# Patient Record
Sex: Male | Born: 1985 | Race: White | Hispanic: No | Marital: Single | Smoking: Never smoker
Health system: Southern US, Community
[De-identification: ages and names within clinical notes are randomized; demographics above are authoritative.]

## PROBLEM LIST (undated history)

## (undated) DIAGNOSIS — F419 Anxiety disorder, unspecified: Secondary | ICD-10-CM

## (undated) HISTORY — PX: WISDOM TOOTH EXTRACTION: SHX21

## (undated) HISTORY — DX: Anxiety disorder, unspecified: F41.9

---
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
## 2007-05-24 ENCOUNTER — Emergency Department (HOSPITAL_COMMUNITY): Admission: EM | Admit: 2007-05-24 | Discharge: 2007-05-25 | Payer: Self-pay | Admitting: Emergency Medicine

## 2008-07-23 ENCOUNTER — Emergency Department (HOSPITAL_COMMUNITY): Admission: EM | Admit: 2008-07-23 | Discharge: 2008-07-24 | Payer: Self-pay | Admitting: Emergency Medicine

## 2009-04-19 IMAGING — CT CT HEAD W/O CM
1 series · 16 of 30 positions shown, 20 images · non-contrast
Comparison: None

CLINICAL DATA: TRAUMA STATUS POST FALL

CT HEAD WITHOUT CONTRAST
TECHNIQUE: Contiguous axial images were obtained from the base of
the skull through the vertex without contrast

[Series 2: headseq 4.8 h45s · axial · 0.48mm/px · z∈[-86,+79]mm · 16 of 36 slices shown, 20 images]
[im 2/36  brain]
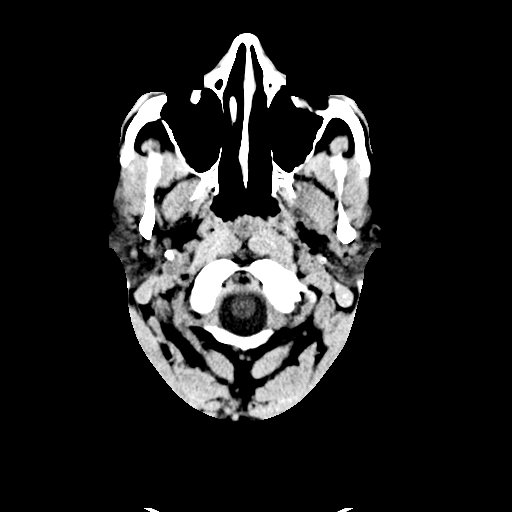
[im 2/36  bone]
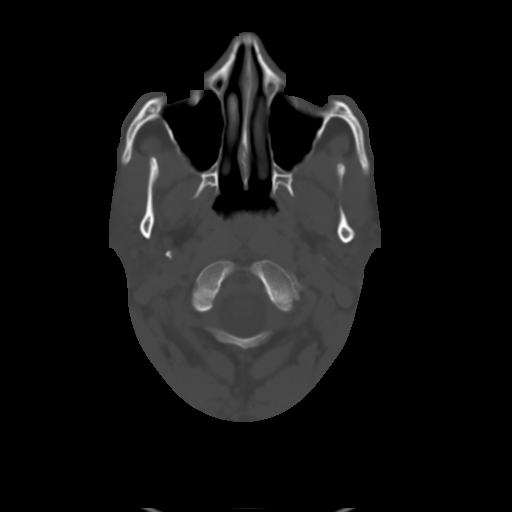
[im 4/36  brain]
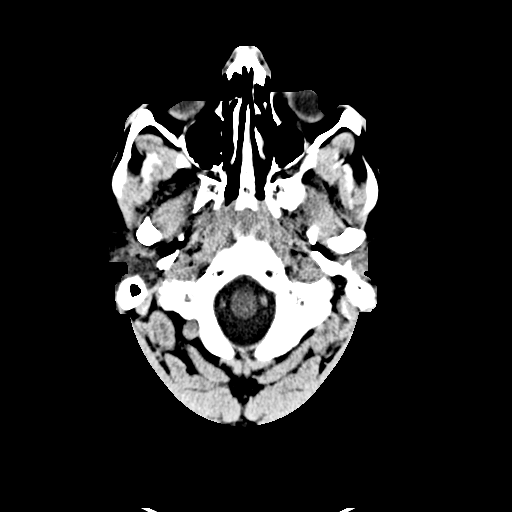
[im 7/36  brain]
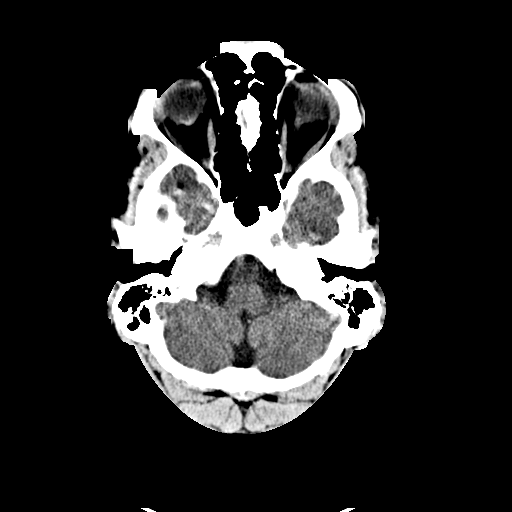
[im 9/36  brain]
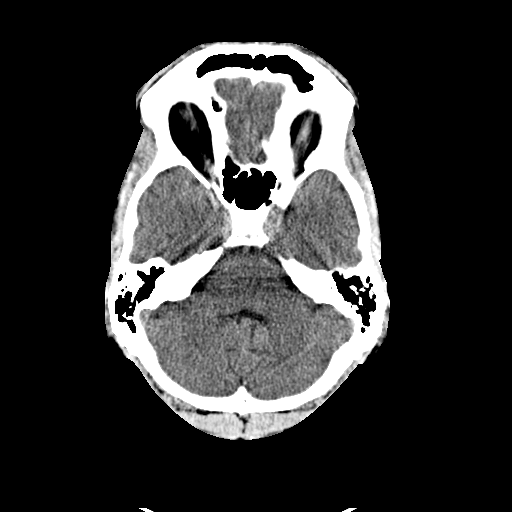
[im 10/36  brain]
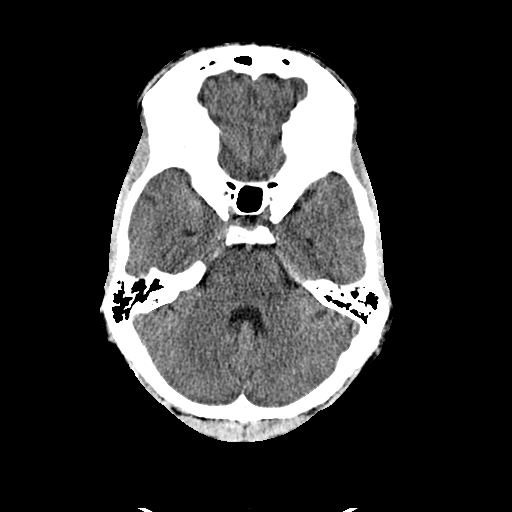
[im 10/36  bone]
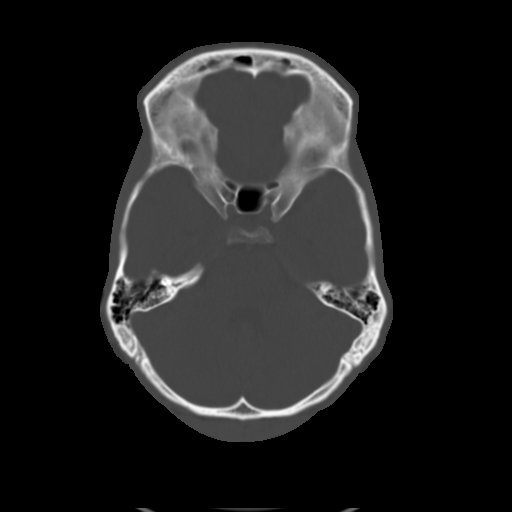
[im 13/36  brain]
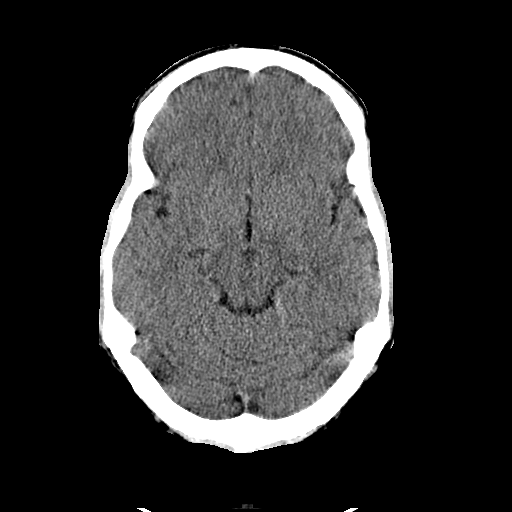
[im 15/36  brain]
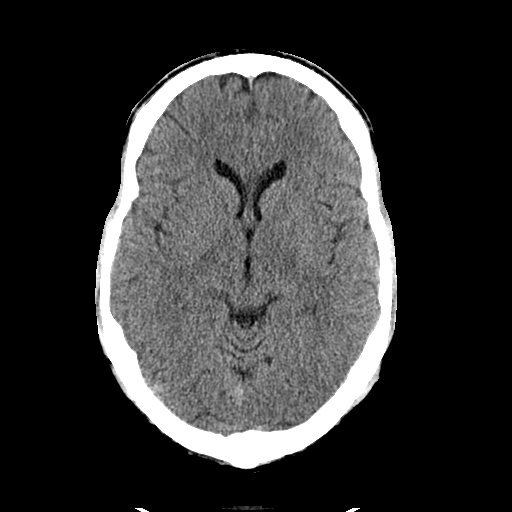
[im 17/36  brain]
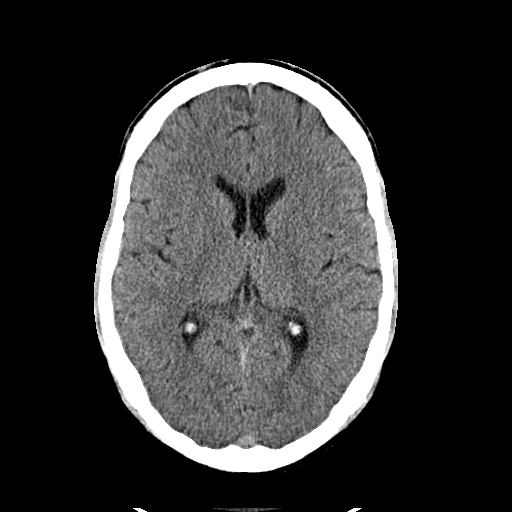
[im 19/36  brain]
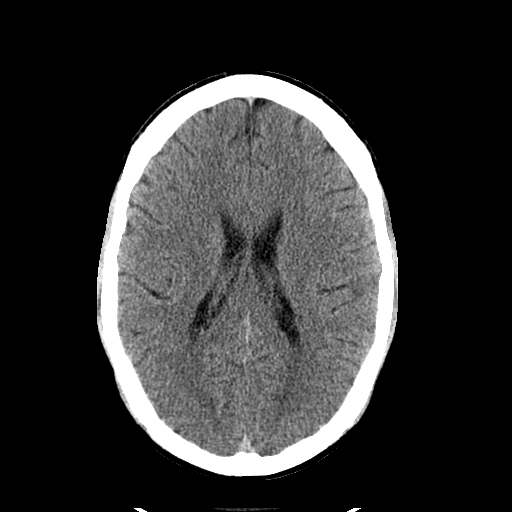
[im 19/36  bone]
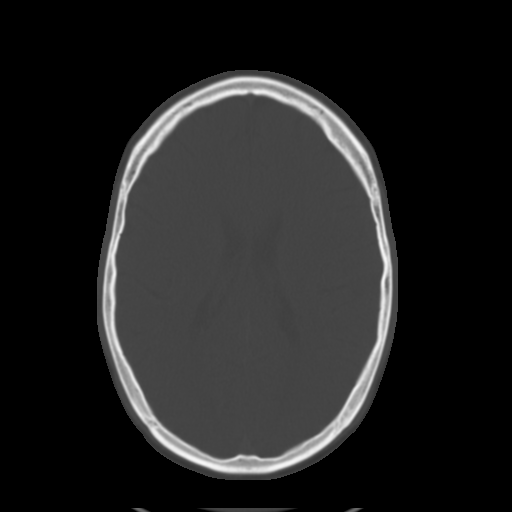
[im 21/36  brain]
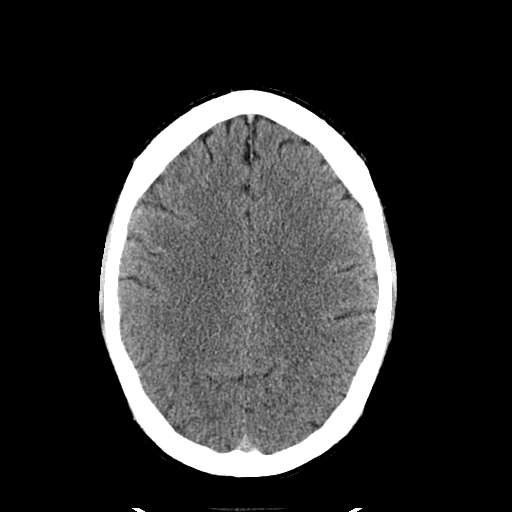
[im 23/36  brain]
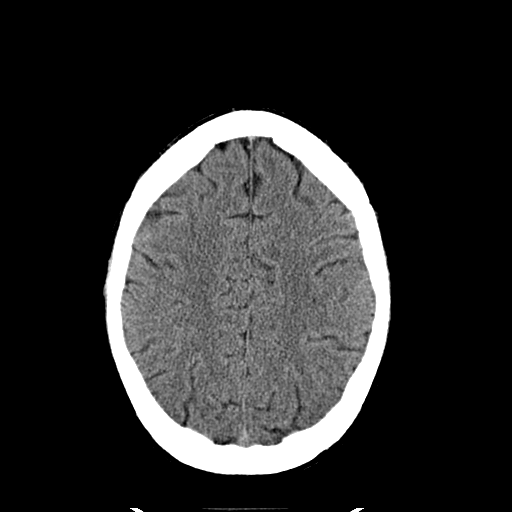
[im 26/36  brain]
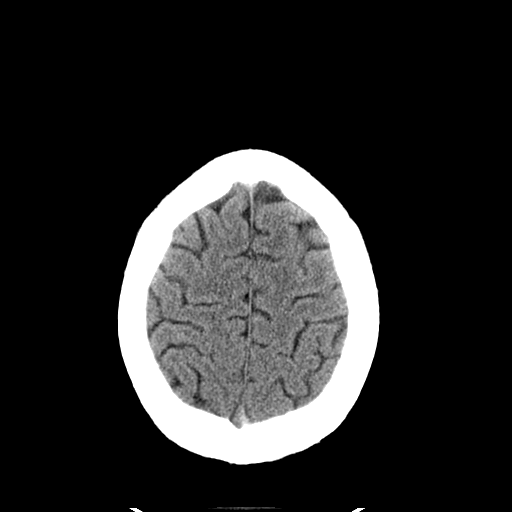
[im 27/36  brain]
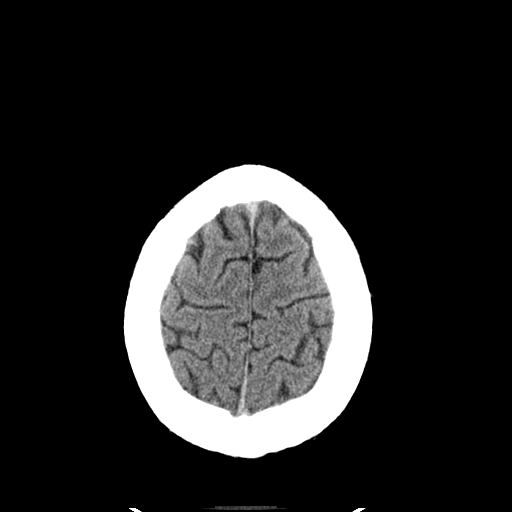
[im 27/36  bone]
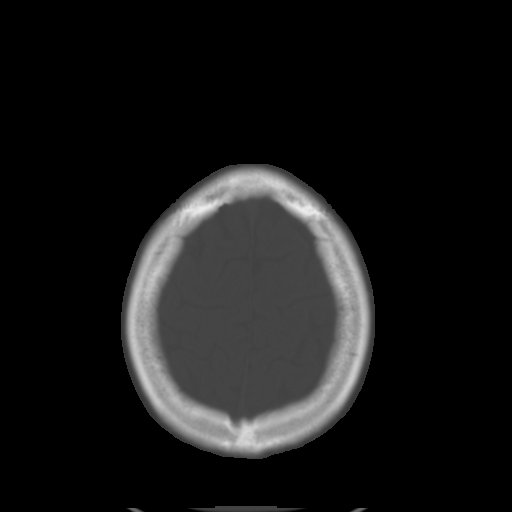
[im 29/36  brain]
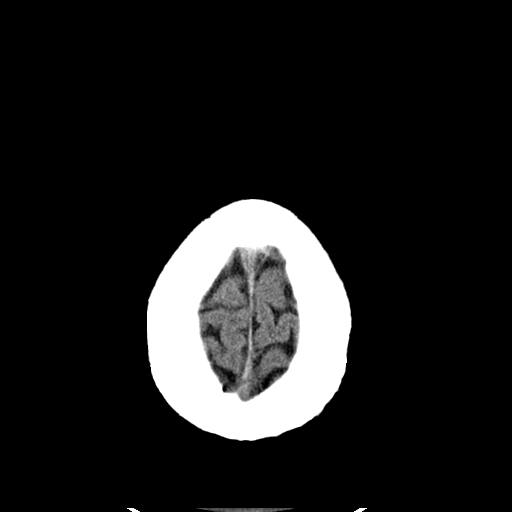
[im 32/36  brain]
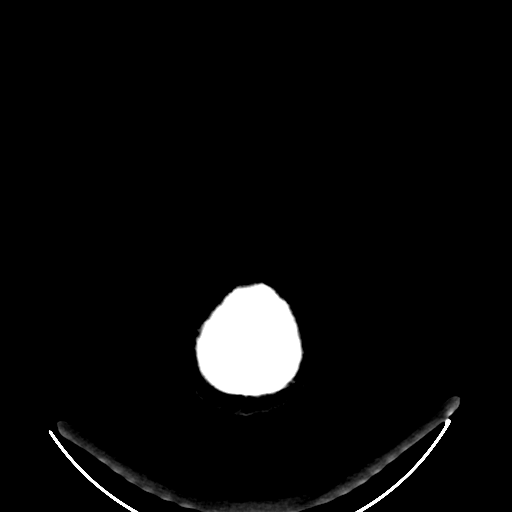
[im 34/36  brain]
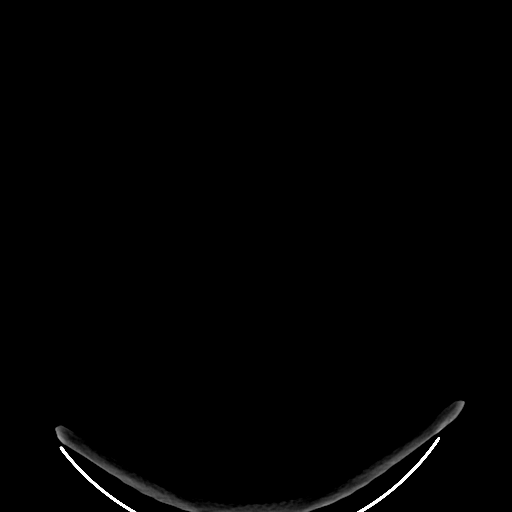

[16 of 30 positions shown; findings below may reference images not displayed]

FINDINGS: The brain has a normal appearance without evidence for
hemorrhage, acute infarction, hydrocephalus, or mass lesion.  There
is no extra axial fluid collection.  The skull and paranasal
sinuses are normal.
IMPRESSION: Normal CT of the head without contrast.

## 2010-05-28 LAB — COMPREHENSIVE METABOLIC PANEL
ALT: 54 U/L — ABNORMAL HIGH (ref 0–53)
AST: 53 U/L — ABNORMAL HIGH (ref 0–37)
Albumin: 4.5 g/dL (ref 3.5–5.2)
Alkaline Phosphatase: 92 U/L (ref 39–117)
BUN: 8 mg/dL (ref 6–23)
CO2: 26 mEq/L (ref 19–32)
Calcium: 9.8 mg/dL (ref 8.4–10.5)
Chloride: 100 mEq/L (ref 96–112)
Creatinine, Ser: 1 mg/dL (ref 0.4–1.5)
GFR calc Af Amer: >60 mL/min (ref >60)
GFR calc non Af Amer: >60 mL/min (ref >60)
Glucose, Bld: 95 mg/dL (ref 70–99)
Potassium: 3.7 mEq/L (ref 3.5–5.1)
Sodium: 138 mEq/L (ref 135–145)
Total Bilirubin: 1.1 mg/dL (ref 0.3–1.2)
Total Protein: 8 g/dL (ref 6.0–8.3)

## 2015-06-26 DIAGNOSIS — S20211A Contusion of right front wall of thorax, initial encounter: Secondary | ICD-10-CM | POA: Diagnosis not present

## 2015-06-26 DIAGNOSIS — S299XXA Unspecified injury of thorax, initial encounter: Secondary | ICD-10-CM | POA: Diagnosis not present

## 2015-12-05 DIAGNOSIS — M9902 Segmental and somatic dysfunction of thoracic region: Secondary | ICD-10-CM | POA: Diagnosis not present

## 2015-12-05 DIAGNOSIS — S161XXA Strain of muscle, fascia and tendon at neck level, initial encounter: Secondary | ICD-10-CM | POA: Diagnosis not present

## 2015-12-05 DIAGNOSIS — M9901 Segmental and somatic dysfunction of cervical region: Secondary | ICD-10-CM | POA: Diagnosis not present

## 2015-12-12 DIAGNOSIS — S39012S Strain of muscle, fascia and tendon of lower back, sequela: Secondary | ICD-10-CM | POA: Diagnosis not present

## 2015-12-12 DIAGNOSIS — M9903 Segmental and somatic dysfunction of lumbar region: Secondary | ICD-10-CM | POA: Diagnosis not present

## 2015-12-25 DIAGNOSIS — S39012D Strain of muscle, fascia and tendon of lower back, subsequent encounter: Secondary | ICD-10-CM | POA: Diagnosis not present

## 2015-12-25 DIAGNOSIS — Z23 Encounter for immunization: Secondary | ICD-10-CM | POA: Diagnosis not present

## 2015-12-25 DIAGNOSIS — M9903 Segmental and somatic dysfunction of lumbar region: Secondary | ICD-10-CM | POA: Diagnosis not present

## 2016-01-25 DIAGNOSIS — M47816 Spondylosis without myelopathy or radiculopathy, lumbar region: Secondary | ICD-10-CM | POA: Diagnosis not present

## 2016-01-25 DIAGNOSIS — M9903 Segmental and somatic dysfunction of lumbar region: Secondary | ICD-10-CM | POA: Diagnosis not present

## 2016-02-29 DIAGNOSIS — S161XXS Strain of muscle, fascia and tendon at neck level, sequela: Secondary | ICD-10-CM | POA: Diagnosis not present

## 2016-02-29 DIAGNOSIS — M9901 Segmental and somatic dysfunction of cervical region: Secondary | ICD-10-CM | POA: Diagnosis not present

## 2016-07-08 ENCOUNTER — Ambulatory Visit (INDEPENDENT_AMBULATORY_CARE_PROVIDER_SITE_OTHER): Payer: BLUE CROSS/BLUE SHIELD | Admitting: Physician Assistant

## 2016-07-08 ENCOUNTER — Ambulatory Visit (INDEPENDENT_AMBULATORY_CARE_PROVIDER_SITE_OTHER): Payer: BLUE CROSS/BLUE SHIELD

## 2016-07-08 ENCOUNTER — Encounter: Payer: Self-pay | Admitting: Physician Assistant

## 2016-07-08 VITALS — BP 132/74 | HR 74 | Temp 98.6°F | Ht 69.0 in | Wt 157.0 lb

## 2016-07-08 DIAGNOSIS — F419 Anxiety disorder, unspecified: Secondary | ICD-10-CM

## 2016-07-08 DIAGNOSIS — L700 Acne vulgaris: Secondary | ICD-10-CM | POA: Insufficient documentation

## 2016-07-08 DIAGNOSIS — S6990XA Unspecified injury of unspecified wrist, hand and finger(s), initial encounter: Secondary | ICD-10-CM

## 2016-07-08 DIAGNOSIS — M20001 Unspecified deformity of right finger(s): Secondary | ICD-10-CM

## 2016-07-08 DIAGNOSIS — S60946A Unspecified superficial injury of right little finger, initial encounter: Secondary | ICD-10-CM

## 2016-07-08 MED ORDER — ALPRAZOLAM 0.5 MG PO TABS: 0.5000 mg | ORAL_TABLET | Freq: Two times a day (BID) | ORAL | 5 refills | Status: DC

## 2016-07-08 NOTE — Progress Notes (Signed)
BP 132/74   Pulse 74   Temp 98.6 F (37 C) (Oral)   Ht 5\' 9"  (1.753 m)   Wt 157 lb (71.2 kg)   BMI 23.18 kg/m    Subjective:    Patient ID: Nathan Walker, male    DOB: 08-15-1985, 31 y.o.   MRN: [id number]  HPI: Nathan Walker is a 31 y.o. male presenting on 07/08/2016 for Hand Pain (right pinky finger, 3-4 wks ago, has not gotten any better)  Patient injured his right pinky finger 3 or 4 weeks ago while playing some sports. He caught a ball incorrectly and it jammed his finger and he has had swelling and decreased range of motion ever since then. He does not recall any specific severe pain when he first injured it. It continues to have swelling and decreased range of motion. Next  Patient is also having more flareup of his acne. Is been many years since he has seen a dermatologist. He would like a referral to a dermatologist. In the meantime he like refills on the medications that he is tried in the past.  Patient has chronic anxiety. He takes Klonopin on a when necessary basis. He does need a refill today. I've instructed them that we will need to see him every 6 months for refill on these medications.  Relevant past medical, surgical, family and social history reviewed and updated as indicated. Allergies and medications reviewed and updated.  Past Medical History:  Diagnosis Date  . Acne   . Anxiety     Past Surgical History:  Procedure Laterality Date  . WISDOM TOOTH EXTRACTION      Review of Systems  Constitutional: Negative.  Negative for appetite change and fatigue.  HENT: Negative.   Eyes: Negative.  Negative for pain and visual disturbance.  Respiratory: Negative.  Negative for cough, chest tightness, shortness of breath and wheezing.   Cardiovascular: Negative.  Negative for chest pain, palpitations and leg swelling.  Gastrointestinal: Negative.  Negative for abdominal pain, diarrhea, nausea and vomiting.  Endocrine: Negative.   Genitourinary: Negative.     Musculoskeletal: Positive for arthralgias and joint swelling.  Skin: Negative.  Negative for color change and rash.  Neurological: Negative.  Negative for weakness, numbness and headaches.  Psychiatric/Behavioral: The patient is nervous/anxious.     Allergies as of 07/08/2016   No Known Allergies     Medication List       Accurate as of 07/08/16  4:56 PM. Always use your most recent med list.          ALPRAZolam 0.5 MG tablet Commonly known as:  XANAX Take 1 tablet (0.5 mg total) by mouth 2 (two) times daily.   benzoyl peroxide 5 % external liquid Generic drug:  benzoyl peroxide USE ONCE DAILY FOR WASHING   ibuprofen 800 MG tablet Commonly known as:  ADVIL,MOTRIN Take 800 mg by mouth 3 (three) times daily.   sulfamethoxazole-trimethoprim 800-160 MG tablet Commonly known as:  BACTRIM DS,SEPTRA DS Take 1 tablet by mouth daily.          Objective:    BP 132/74   Pulse 74   Temp 98.6 F (37 C) (Oral)   Ht 5\' 9"  (1.753 m)   Wt 157 lb (71.2 kg)   BMI 23.18 kg/m   No Known Allergies  Physical Exam  Constitutional: He appears well-developed and well-nourished. No distress.  HENT:  Head: Normocephalic and atraumatic.  Eyes: Conjunctivae and EOM are normal. Pupils are equal,  round, and reactive to light.  Cardiovascular: Normal rate, regular rhythm and normal heart sounds.   Pulmonary/Chest: Effort normal and breath sounds normal. No respiratory distress.  Musculoskeletal:       Right hand: He exhibits decreased range of motion, tenderness and swelling. He exhibits no deformity.       Hands: Skin: Skin is warm and dry.  Psychiatric: He has a normal mood and affect. His behavior is normal.  Nursing note and vitals reviewed.       Assessment & Plan:   1. Finger injury, initial encounter - DG Finger Little Right; Future  2. Deviation of finger of right hand - DG Finger Little Right; Future  3. Anxiety - ALPRAZolam (XANAX) 0.5 MG tablet; Take 1 tablet  (0.5 mg total) by mouth 2 (two) times daily.  Dispense: 60 tablet; Refill: 5  4. Acne vulgaris - sulfamethoxazole-trimethoprim (BACTRIM DS,SEPTRA DS) 800-160 MG tablet; Take 1 tablet by mouth daily.; Refill: 1 - BENZOYL PEROXIDE 5 % external wash; USE ONCE DAILY FOR WASHING; Refill: 11 - Ambulatory referral to Dermatology   Continue all other maintenance medications as listed above.  Follow up plan: Return in about 6 months (around 01/08/2017) for recheck meds.  Educational handout given for sprain finger  [name] PA-C [facility] [address]  [place], [place] [address] [phone]   07/08/2016, 4:56 PM

## 2016-07-08 NOTE — Patient Instructions (Signed)
Finger Sprain  A finger sprain is an injury to one of the strong bands of tissue (ligaments) that connect the bones in the finger. The ligament can be stretched too much, or it can tear. A tear can be either partial or complete. The severity of the sprain depends on how much of the ligament was damaged or torn.  CAUSES  This injury is often caused by a fall or an accident. For example, if you extend your hands to catch an object or to protect yourself during a fall, the force of impact may cause the ligaments in your finger to stretch too much.  RISK FACTORS  The following factors may make you more likely to have this injury:   Playing sports that involve a greater risk of falling, such as skiing.   Playing sports that involve catching an object, such as basketball.   Having poor strength and flexibility.  SYMPTOMS  Symptoms of this condition include:   Pain at the affected finger joint, especially when bending or extending the finger.   Loss of motion in the finger.   Swelling.   Tenderness.   Bruising.  DIAGNOSIS  This condition is diagnosed with a medical history and physical exam. You may also have an X-ray of your finger to rule out a fracture or dislocation.  TREATMENT  Treatment varies depending on the severity of the sprain. If your ligament is overstretched or partially torn, treatment usually involves:   Keeping the finger in a fixed position (immobilization) for a period of time. To help you do this, your health care provider may apply a bandage, splint, or cast to keep the finger from moving until it heals. In some cases, the finger may be taped to the fingers beside it (buddy taping).   Taking medicines for pain.   Doing exercises for the finger after it has begun to heal.  If your ligament is fully torn, you may need surgery to reconnect the ligament to the bone. After surgery, a cast or splint will be applied.  HOME CARE INSTRUCTIONS  If You Have a Splint:   Wear the splint as told by  your health care provider. Remove it only as told by your health care provider.   Loosen the splint if your fingers tingle, become numb, or turn cold and blue.   Do not let your splint get wet if it is not waterproof.   Keep the splint clean.  If You Have a Cast:   Do not stick anything inside the cast to scratch your skin. Doing that increases your risk of infection.   Check the skin around the cast every day. Tell your health care provider about any concerns.   You may put lotion on dry skin around the edges of the cast. Do not put lotion on the skin underneath the cast.   Do not let your cast get wet if it is not waterproof.   Keep the cast clean.  Bathing   If your splint or cast is not waterproof, cover it with a watertight plastic bag when you take a bath or a shower.   Keep any bandages (dressings) dry until your health care provider says they can be removed.  Managing Pain, Stiffness, and Swelling   If directed, put ice on the injured area:  ? Put ice in a plastic bag.  ? Place a towel between your skin and the bag.  ? Leave the ice on for 20 minutes, 2-3 times a day.     Move your fingers often to avoid stiffness and to lessen swelling.   Raise (elevate) the injured area above the level of your heart while you are sitting or lying down.  General Instructions   Do not put pressure on any part of the cast or splint until it is fully hardened. This may take several hours.   Take over-the-counter and prescription medicines only as told by your health care provider.   Do not drive or operate heavy machinery while taking prescription pain medicine.   Do exercises as told by your health care provider or physical therapist.   Do not wear rings on your injured finger.   Keep all follow-up visits as told by your health care provider. This is important.  SEEK MEDICAL CARE IF:   Your pain is not controlled with medicine.   Your bruising or swelling gets worse.   Your cast or splint is  damaged.   Your finger is numb or blue.   Your finger feels colder than normal.  This information is not intended to replace advice given to you by your health care provider. Make sure you discuss any questions you have with your health care provider.  Document Released: 03/14/2004 Document Revised: 05/29/2015 Document Reviewed: 12/15/2014  Elsevier Interactive Patient Education  2017 [employer]vier Inc[employer]

## 2016-07-30 DIAGNOSIS — L709 Acne, unspecified: Secondary | ICD-10-CM | POA: Diagnosis not present

## 2016-07-30 DIAGNOSIS — D229 Melanocytic nevi, unspecified: Secondary | ICD-10-CM | POA: Diagnosis not present

## 2016-10-09 ENCOUNTER — Other Ambulatory Visit: Payer: Self-pay | Admitting: Physician Assistant

## 2016-10-09 DIAGNOSIS — L700 Acne vulgaris: Secondary | ICD-10-CM

## 2016-11-11 ENCOUNTER — Telehealth: Payer: Self-pay | Admitting: Physician Assistant

## 2016-11-11 MED ORDER — CLINDAMYCIN HCL 300 MG PO CAPS: 300.0000 mg | ORAL_CAPSULE | Freq: Three times a day (TID) | ORAL | 0 refills | Status: DC

## 2016-11-11 NOTE — Telephone Encounter (Signed)
Per pt he was seen by dentist He has infected tooth on L lower jaw back tooth Dentist wanted to do root canal but pt declined Pt requesting antibiotic 1st available appt with you is on Weds Please advise

## 2016-11-11 NOTE — Telephone Encounter (Signed)
Left detailed message for pt regarding RX 

## 2016-11-13 ENCOUNTER — Ambulatory Visit: Payer: BLUE CROSS/BLUE SHIELD | Admitting: Physician Assistant

## 2016-11-13 ENCOUNTER — Encounter: Payer: Self-pay | Admitting: Physician Assistant

## 2017-02-20 ENCOUNTER — Other Ambulatory Visit: Payer: Self-pay | Admitting: Physician Assistant

## 2017-02-20 DIAGNOSIS — F419 Anxiety disorder, unspecified: Secondary | ICD-10-CM

## 2017-03-14 ENCOUNTER — Ambulatory Visit: Payer: BLUE CROSS/BLUE SHIELD | Admitting: Physician Assistant

## 2017-03-14 ENCOUNTER — Encounter: Payer: Self-pay | Admitting: Physician Assistant

## 2017-03-14 VITALS — BP 132/80 | HR 78 | Temp 98.7°F | Ht 69.0 in | Wt 159.2 lb

## 2017-03-14 DIAGNOSIS — F419 Anxiety disorder, unspecified: Secondary | ICD-10-CM

## 2017-03-14 DIAGNOSIS — B079 Viral wart, unspecified: Secondary | ICD-10-CM | POA: Diagnosis not present

## 2017-03-14 DIAGNOSIS — L700 Acne vulgaris: Secondary | ICD-10-CM

## 2017-03-14 DIAGNOSIS — Z23 Encounter for immunization: Secondary | ICD-10-CM | POA: Diagnosis not present

## 2017-03-14 MED ORDER — ALPRAZOLAM 0.5 MG PO TABS: 0.5000 mg | ORAL_TABLET | Freq: Two times a day (BID) | ORAL | 5 refills | Status: DC

## 2017-03-14 MED ORDER — BENZOYL PEROXIDE 5 % EX LIQD: CUTANEOUS | 11 refills | Status: DC

## 2017-03-17 ENCOUNTER — Encounter: Payer: Self-pay | Admitting: Physician Assistant

## 2017-03-17 NOTE — Patient Instructions (Signed)
In a few days you may receive a survey in the mail or online from [employer] regarding your visit with us today. Please take a moment to fill this out. Your feedback is very important to our whole office. It can help us better understand your needs as well as improve your experience and satisfaction. Thank you for taking your time to complete it. We care about you.  Kiyo Heal, PA-C  

## 2017-03-17 NOTE — Progress Notes (Signed)
BP 132/80   Pulse 78   Temp 98.7 F (37.1 C) (Oral)   Ht 5\' 9"  (1.753 m)   Wt 159 lb 3.2 oz (72.2 kg)   BMI 23.51 kg/m    Subjective:    Patient ID: MD SMOLA, male    DOB: July 29, 1985, 32 y.o.   MRN: [id number]  HPI: Nathan Walker is a 32 y.o. male presenting on 03/14/2017 for Leg Pain (left )  Patient comes in for recheck on his conditions.  He does need refills on his acne medication hit with a ball when he was playing sports a large bruise that he showed me a picture from his phone.  He states that now she is mildly tender.  This portion of the surface.  There is no dysfunction in his legs.  She also has a very small wart that he started on his finger.  He has had a history of multiple common warts in the past.  Relevant past medical, surgical, family and social history reviewed and updated as indicated. Allergies and medications reviewed and updated.  Past Medical History:  Diagnosis Date  . Anxiety     Past Surgical History:  Procedure Laterality Date  . WISDOM TOOTH EXTRACTION      Review of Systems  Constitutional: Negative.  Negative for appetite change and fatigue.  HENT: Negative.   Eyes: Negative.  Negative for pain and visual disturbance.  Respiratory: Negative.  Negative for cough, chest tightness, shortness of breath and wheezing.   Cardiovascular: Negative.  Negative for chest pain, palpitations and leg swelling.  Gastrointestinal: Negative.  Negative for abdominal pain, diarrhea, nausea and vomiting.  Endocrine: Negative.   Genitourinary: Negative.   Musculoskeletal: Positive for myalgias.  Skin: Positive for rash. Negative for color change.  Neurological: Negative.  Negative for weakness, numbness and headaches.  Psychiatric/Behavioral: Negative.     Allergies as of 03/14/2017   No Known Allergies     Medication List        Accurate as of 03/14/17 11:59 PM. Always use your most recent med list.          ALPRAZolam 0.5 MG tablet Commonly  known as:  XANAX Take 1 tablet (0.5 mg total) by mouth 2 (two) times daily.   benzoyl peroxide 5 % external liquid Commonly known as:  benzoyl peroxide USE ONCE DAILY FOR WASHING   ibuprofen 800 MG tablet Commonly known as:  ADVIL,MOTRIN Take 800 mg by mouth 3 (three) times daily.   sulfamethoxazole-trimethoprim 800-160 MG tablet Commonly known as:  BACTRIM DS,SEPTRA DS Take 1 tablet by mouth daily.          Objective:    BP 132/80   Pulse 78   Temp 98.7 F (37.1 C) (Oral)   Ht 5\' 9"  (1.753 m)   Wt 159 lb 3.2 oz (72.2 kg)   BMI 23.51 kg/m   No Known Allergies  Physical Exam  Constitutional: He appears well-developed and well-nourished.  HENT:  Head: Normocephalic and atraumatic.  Eyes: Conjunctivae and EOM are normal. Pupils are equal, round, and reactive to light.  Neck: Normal range of motion. Neck supple.  Cardiovascular: Normal rate, regular rhythm and normal heart sounds.  Pulmonary/Chest: Effort normal and breath sounds normal.  Abdominal: Soft. Bowel sounds are normal.  Musculoskeletal: Normal range of motion.       Left upper leg: He exhibits tenderness. He exhibits no bony tenderness, no swelling, no edema and no deformity.  Legs: Left thigh area without discoloration.  Tenderness has resolved since injury a few months ago.  Skin: Skin is warm and dry. Lesion noted.  1 very small wart on        Assessment & Plan:   1. Viral warts, unspecified type Treatment to wart on the index finger Patient tolerated well  2. Anxiety - ALPRAZolam (XANAX) 0.5 MG tablet; Take 1 tablet (0.5 mg total) by mouth 2 (two) times daily.  Dispense: 60 tablet; Refill: 5  3. Need for immunization against influenza - Flu Vaccine QUAD 36+ mos IM  4. Acne vulgaris - benzoyl peroxide (BENZOYL PEROXIDE) 5 % external liquid; USE ONCE DAILY FOR WASHING  Dispense: 226 g; Refill: 11    Current Outpatient Medications:  .  ALPRAZolam (XANAX) 0.5 MG tablet, Take 1 tablet  (0.5 mg total) by mouth 2 (two) times daily., Disp: 60 tablet, Rfl: 5 .  benzoyl peroxide (BENZOYL PEROXIDE) 5 % external liquid, USE ONCE DAILY FOR WASHING, Disp: 226 g, Rfl: 11 .  ibuprofen (ADVIL,MOTRIN) 800 MG tablet, Take 800 mg by mouth 3 (three) times daily., Disp: , Rfl: 10 .  sulfamethoxazole-trimethoprim (BACTRIM DS,SEPTRA DS) 800-160 MG tablet, Take 1 tablet by mouth daily., Disp: , Rfl: 1 Continue all other maintenance medications as listed above.  Follow up plan: No Follow-up on file.  Educational handout given for survey  Remus LofflerAngel S. Wyland Rastetter PA-C [facility] [address]  [place], [place] [id number] [phone]   03/17/2017, 12:33 PM

## 2017-03-18 ENCOUNTER — Telehealth: Payer: Self-pay | Admitting: Physician Assistant

## 2017-03-18 ENCOUNTER — Other Ambulatory Visit: Payer: Self-pay | Admitting: Physician Assistant

## 2017-03-18 MED ORDER — GABAPENTIN 100 MG PO CAPS: 100.0000 mg | ORAL_CAPSULE | Freq: Every day | ORAL | 3 refills | Status: DC

## 2017-03-18 NOTE — Telephone Encounter (Signed)
sent 

## 2017-03-18 NOTE — Telephone Encounter (Signed)
Patient aware.

## 2017-04-29 ENCOUNTER — Telehealth: Payer: Self-pay | Admitting: Physician Assistant

## 2017-04-29 ENCOUNTER — Other Ambulatory Visit: Payer: Self-pay | Admitting: Physician Assistant

## 2017-04-29 MED ORDER — OSELTAMIVIR PHOSPHATE 75 MG PO CAPS: 75.0000 mg | ORAL_CAPSULE | Freq: Two times a day (BID) | ORAL | 0 refills | Status: DC

## 2017-04-29 NOTE — Telephone Encounter (Signed)
What symptoms do you have? Flu symptoms  How long have you been sick? Sunday  Have you been seen for this problem? No, offered appt but wants to ask Lawanna Kobusngel first if she can call him in tamiflu  If your provider decides to give you a prescription, which pharmacy would you like for it to be sent to[place] [facility] [place]   Patient informed that this information will be sent to the clinical staff for review and that they should receive a follow up call.

## 2017-04-29 NOTE — Telephone Encounter (Signed)
tamiflu sent

## 2017-04-29 NOTE — Telephone Encounter (Signed)
Patient aware.

## 2017-05-03 ENCOUNTER — Other Ambulatory Visit: Payer: Self-pay | Admitting: Physician Assistant

## 2017-05-03 DIAGNOSIS — L700 Acne vulgaris: Secondary | ICD-10-CM

## 2017-10-09 ENCOUNTER — Other Ambulatory Visit: Payer: Self-pay | Admitting: Physician Assistant

## 2017-10-09 DIAGNOSIS — F419 Anxiety disorder, unspecified: Secondary | ICD-10-CM

## 2017-10-13 NOTE — Telephone Encounter (Signed)
Last seen 03/14/17  Nathan Walker 

## 2018-01-07 ENCOUNTER — Other Ambulatory Visit: Payer: Self-pay | Admitting: Physician Assistant

## 2018-01-07 DIAGNOSIS — F419 Anxiety disorder, unspecified: Secondary | ICD-10-CM

## 2018-01-08 NOTE — Telephone Encounter (Signed)
Last seen 01/22/18

## 2018-01-12 ENCOUNTER — Encounter: Payer: Self-pay | Admitting: Physician Assistant

## 2018-01-12 ENCOUNTER — Ambulatory Visit: Payer: BLUE CROSS/BLUE SHIELD | Admitting: Physician Assistant

## 2018-01-12 VITALS — BP 123/71 | HR 72 | Temp 98.3°F | Ht 69.0 in | Wt 165.2 lb

## 2018-01-12 DIAGNOSIS — F419 Anxiety disorder, unspecified: Secondary | ICD-10-CM | POA: Diagnosis not present

## 2018-01-12 DIAGNOSIS — L659 Nonscarring hair loss, unspecified: Secondary | ICD-10-CM

## 2018-01-12 DIAGNOSIS — L700 Acne vulgaris: Secondary | ICD-10-CM

## 2018-01-12 DIAGNOSIS — J0110 Acute frontal sinusitis, unspecified: Secondary | ICD-10-CM | POA: Diagnosis not present

## 2018-01-12 MED ORDER — ALPRAZOLAM 0.5 MG PO TABS: 0.5000 mg | ORAL_TABLET | Freq: Two times a day (BID) | ORAL | 5 refills | Status: DC

## 2018-01-12 MED ORDER — CEFDINIR 300 MG PO CAPS: 300.0000 mg | ORAL_CAPSULE | Freq: Two times a day (BID) | ORAL | 0 refills | Status: DC

## 2018-01-12 MED ORDER — FINASTERIDE 1 MG PO TABS: 1.0000 mg | ORAL_TABLET | Freq: Every day | ORAL | 11 refills | Status: DC

## 2018-01-12 MED ORDER — MINOCYCLINE HCL 50 MG PO TABS: 50.0000 mg | ORAL_TABLET | Freq: Two times a day (BID) | ORAL | 11 refills | Status: DC

## 2018-01-12 NOTE — Progress Notes (Signed)
BP 123/71   Pulse 72   Temp 98.3 F (36.8 C) (Oral)   Ht 5' 9"  (1.753 m)   Wt 165 lb 3.2 oz (74.9 kg)   BMI 24.40 kg/m    Subjective:    Patient ID: Nathan Walker, male    DOB: 16-May-1985, 32 y.o.   MRN: [id number]  HPI: Nathan Walker is a 32 y.o. male presenting on 01/12/2018 for Anxiety (medication refill); Cough; Fever; and Nasal Congestion This patient has had many days of sinus headache and postnasal drainage. There is copious drainage at times. Denies any fever at this time. There has been a history of sinus infections in the past.  No history of sinus surgery. There is cough at night. It has become more prevalent in recent days.  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  He would like to have refills on his anxiety medicine, his minocycline which he is used with acne in the past like to try Propecia again.  He is not having any other issues at this time.  Past Medical History:  Diagnosis Date  . Anxiety    Relevant past medical, surgical, family and social history reviewed and updated as indicated. Interim medical history since our last visit reviewed. Allergies and medications reviewed and updated. DATA REVIEWED: CHART IN EPIC  Family History reviewed for pertinent findings.  Review of Systems  Constitutional: Positive for fatigue. Negative for appetite change.  HENT: Positive for sinus pressure and sore throat.   Eyes: Negative.  Negative for pain and visual disturbance.  Respiratory: Positive for shortness of breath and wheezing. Negative for cough and chest tightness.   Cardiovascular: Negative.  Negative for chest pain, palpitations and leg swelling.  Gastrointestinal: Negative.  Negative for abdominal pain, diarrhea, nausea and vomiting.  Endocrine: Negative.   Genitourinary: Negative.   Musculoskeletal: Positive for back pain and myalgias.  Skin: Negative.  Negative for color change and rash.  Neurological:  Positive for headaches. Negative for weakness and numbness.  Psychiatric/Behavioral: Negative.     Allergies as of 01/12/2018   No Known Allergies     Medication List        Accurate as of 01/12/18 10:02 PM. Always use your most recent med list.          ALPRAZolam 0.5 MG tablet Commonly known as:  XANAX Take 1 tablet (0.5 mg total) by mouth 2 (two) times daily.   benzoyl peroxide 5 % external liquid USE ONCE DAILY FOR WASHING   cefdinir 300 MG capsule Commonly known as:  OMNICEF Take 1 capsule (300 mg total) by mouth 2 (two) times daily. 1 po BID   finasteride 1 MG tablet Commonly known as:  PROPECIA Take 1 tablet (1 mg total) by mouth daily.   minocycline 50 MG tablet Commonly known as:  DYNACIN Take 1 tablet (50 mg total) by mouth 2 (two) times daily.          Objective:    BP 123/71   Pulse 72   Temp 98.3 F (36.8 C) (Oral)   Ht 5' 9"  (1.753 m)   Wt 165 lb 3.2 oz (74.9 kg)   BMI 24.40 kg/m   No Known Allergies  Wt Readings from Last 3 Encounters:  01/12/18 165 lb 3.2 oz (74.9 kg)  03/14/17 159 lb 3.2 oz (72.2 kg)  07/08/16 157 lb (71.2 kg)    Physical Exam  Constitutional: He is oriented  to person, place, and time. He appears well-developed and well-nourished.  HENT:  Head: Normocephalic and atraumatic.  Right Ear: Tympanic membrane and external ear normal. No middle ear effusion.  Left Ear: Tympanic membrane and external ear normal.  No middle ear effusion.  Nose: Mucosal edema and rhinorrhea present. Right sinus exhibits no maxillary sinus tenderness. Left sinus exhibits no maxillary sinus tenderness.  Mouth/Throat: Uvula is midline. Posterior oropharyngeal erythema present.  Eyes: Pupils are equal, round, and reactive to light. Conjunctivae and EOM are normal. Right eye exhibits no discharge. Left eye exhibits no discharge.  Neck: Normal range of motion.  Cardiovascular: Normal rate, regular rhythm and normal heart sounds.  Pulmonary/Chest:  Effort normal and breath sounds normal. No respiratory distress. He has no wheezes.  Abdominal: Soft.  Lymphadenopathy:    He has no cervical adenopathy.  Neurological: He is alert and oriented to person, place, and time.  Skin: Skin is warm and dry.  Psychiatric: He has a normal mood and affect.    Results for orders placed or performed during the hospital encounter of 07/23/08  Comprehensive metabolic panel  Result Value Ref Range   Sodium 138 135 - 145 mEq/L   Potassium 3.7 3.5 - 5.1 mEq/L   Chloride 100 96 - 112 mEq/L   CO2 26 19 - 32 mEq/L   Glucose, Bld 95 70 - 99 mg/dL   BUN 8 6 - 23 mg/dL   Creatinine, Ser 1.00 0.4 - 1.5 mg/dL   Calcium 9.8 8.4 - 10.5 mg/dL   Total Protein 8.0 6.0 - 8.3 g/dL   Albumin 4.5 3.5 - 5.2 g/dL   AST 53 (H) 0 - 37 U/L   ALT 54 (H) 0 - 53 U/L   Alkaline Phosphatase 92 39 - 117 U/L   Total Bilirubin 1.1 0.3 - 1.2 mg/dL   GFR calc non Af Amer >60 >60 mL/min   GFR calc Af Amer  >60 mL/min    >60        The eGFR has been calculated using the MDRD equation. This calculation has not been validated in all clinical situations. eGFR's persistently <60 mL/min signify possible Chronic Kidney Disease.      Assessment & Plan:   1. Anxiety - ALPRAZolam (XANAX) 0.5 MG tablet; Take 1 tablet (0.5 mg total) by mouth 2 (two) times daily.  Dispense: 60 tablet; Refill: 5  2. Acute non-recurrent frontal sinusitis - cefdinir (OMNICEF) 300 MG capsule; Take 1 capsule (300 mg total) by mouth 2 (two) times daily. 1 po BID  Dispense: 20 capsule; Refill: 0  3. Acne vulgaris - minocycline (DYNACIN) 50 MG tablet; Take 1 tablet (50 mg total) by mouth 2 (two) times daily.  Dispense: 60 tablet; Refill: 11  4. Hair loss - finasteride (PROPECIA) 1 MG tablet; Take 1 tablet (1 mg total) by mouth daily.  Dispense: 30 tablet; Refill: 11   Continue all other maintenance medications as listed above.  Follow up plan: No follow-ups on file.  Educational handout given  for [place] PA-C [facility] [address]  [place], [place] [phone]   01/12/2018, 10:02 PM

## 2018-02-06 ENCOUNTER — Ambulatory Visit (INDEPENDENT_AMBULATORY_CARE_PROVIDER_SITE_OTHER): Payer: BLUE CROSS/BLUE SHIELD

## 2018-02-06 DIAGNOSIS — Z23 Encounter for immunization: Secondary | ICD-10-CM | POA: Diagnosis not present

## 2018-03-02 ENCOUNTER — Other Ambulatory Visit: Payer: Self-pay | Admitting: Physician Assistant

## 2018-03-02 DIAGNOSIS — J0110 Acute frontal sinusitis, unspecified: Secondary | ICD-10-CM

## 2018-03-02 NOTE — Telephone Encounter (Signed)
Last seen 01/12/18 

## 2018-03-27 ENCOUNTER — Other Ambulatory Visit: Payer: Self-pay | Admitting: Physician Assistant

## 2018-06-04 IMAGING — DX DG FINGER LITTLE 2+V*R*
3 series · 3 of 3 positions shown · non-contrast
Comparison: None.

CLINICAL DATA: Injury 3 weeks ago.

EXAM:
RIGHT LITTLE FINGER 2+V

[finger ap]
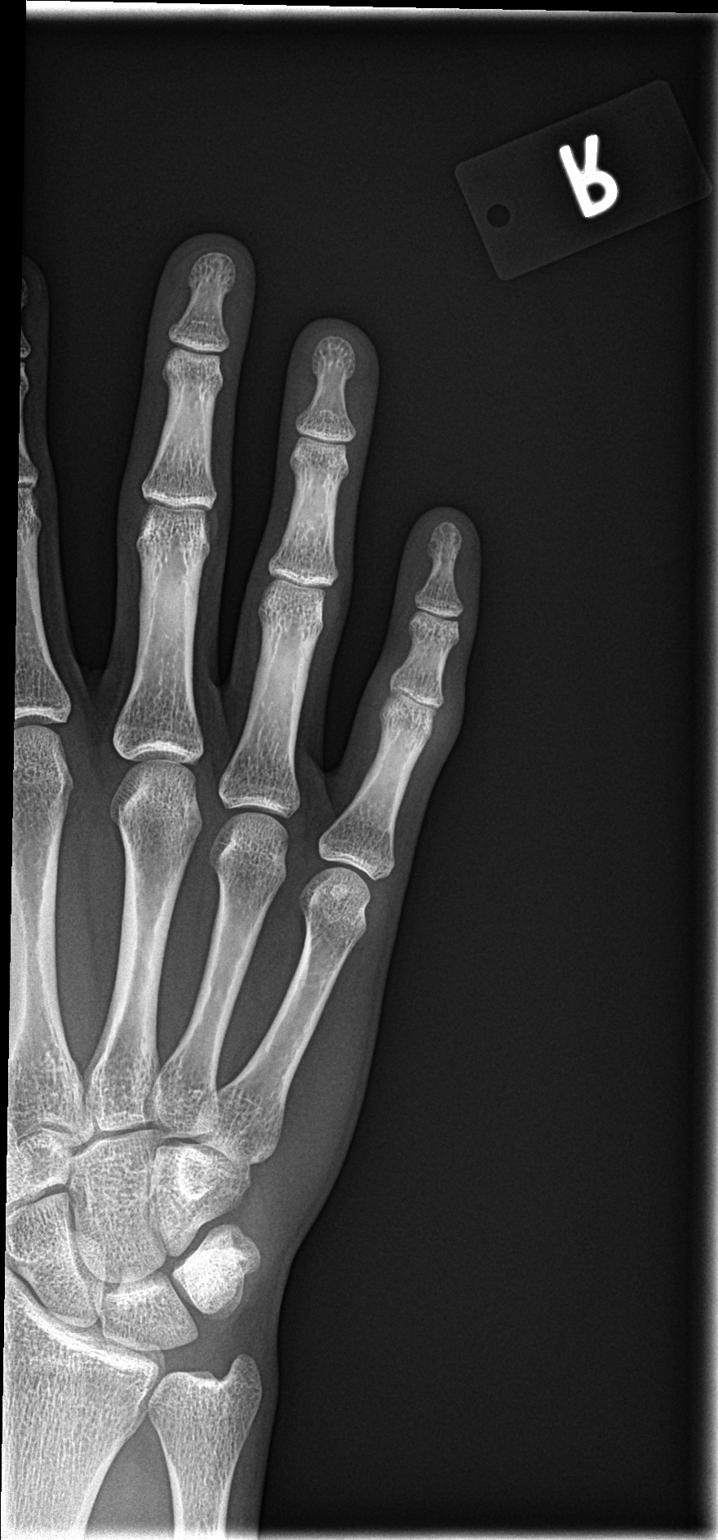

[finger obl]
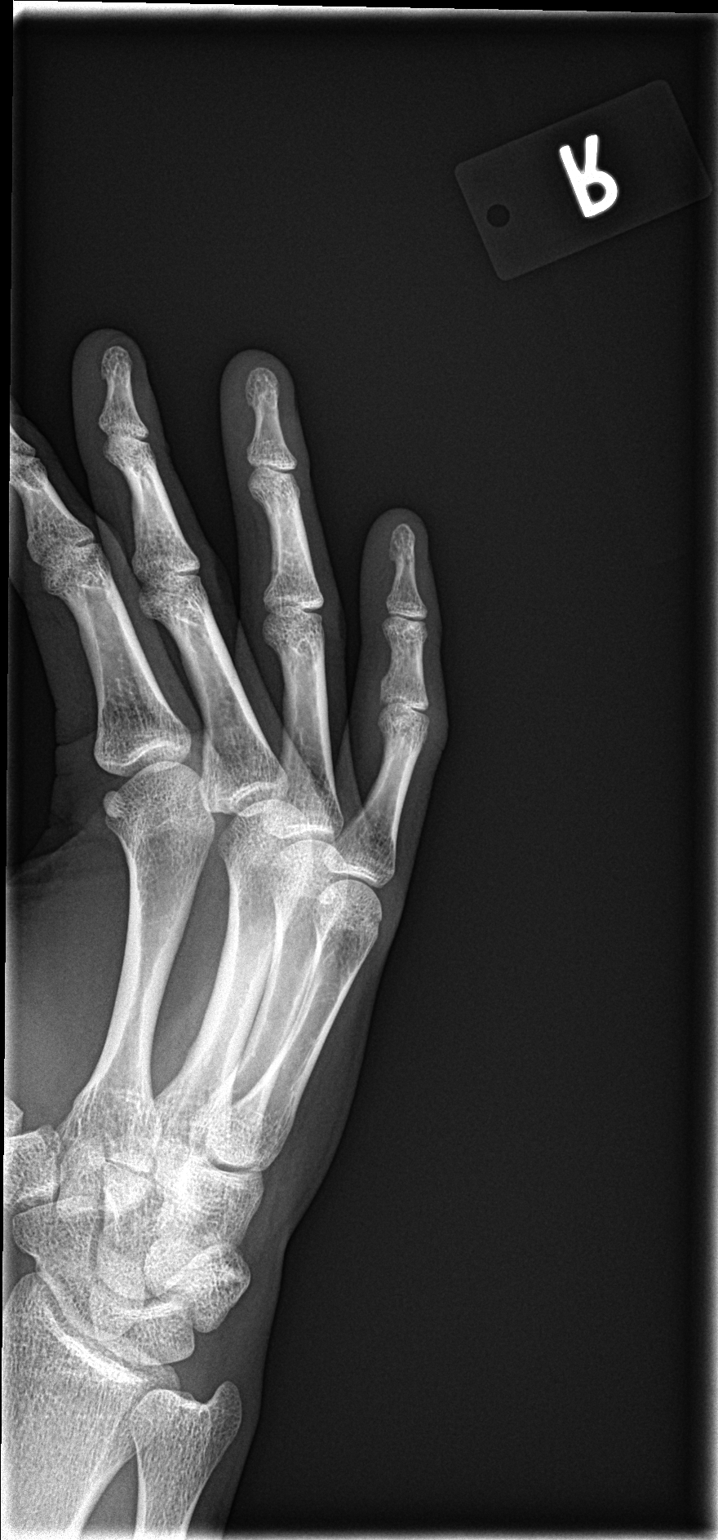

[finger lat]
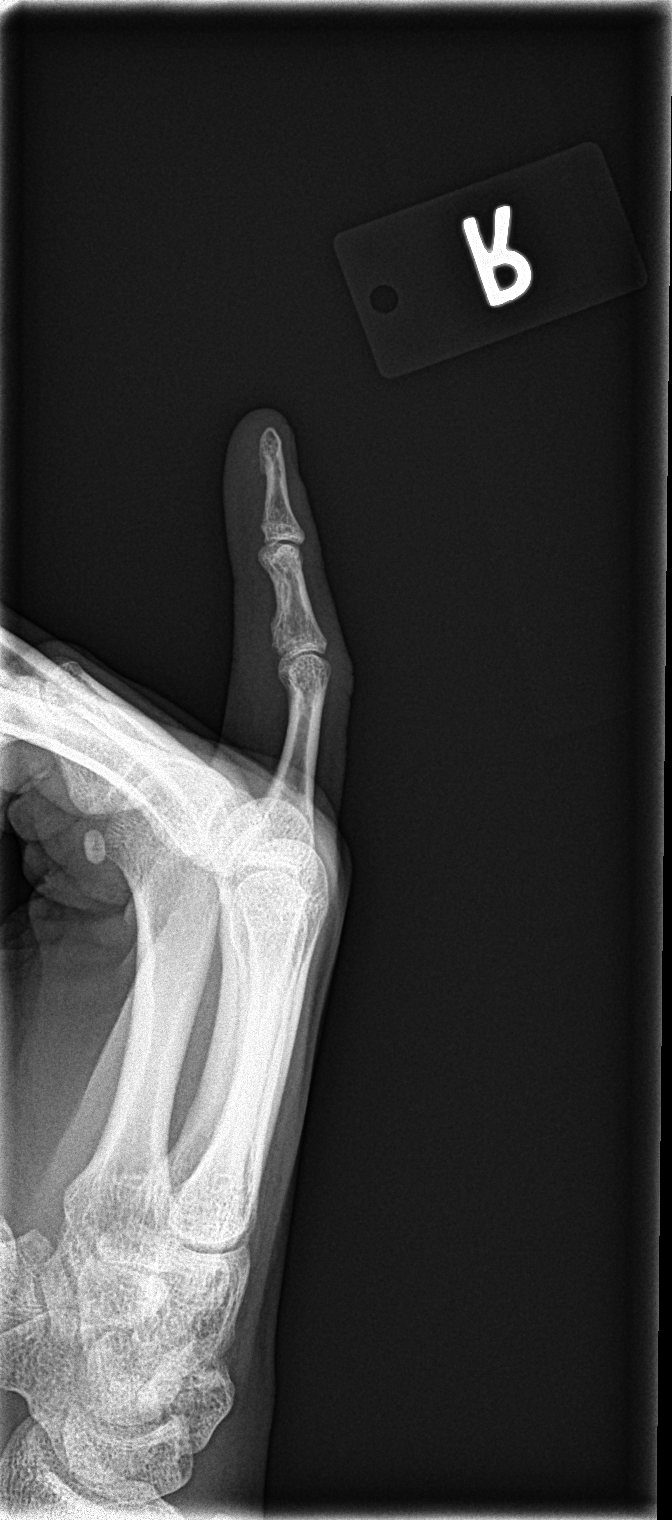

[3 of 3 positions shown; findings below may reference images not displayed]

FINDINGS: There is no evidence of fracture or dislocation. There is no
evidence of arthropathy or other focal bone abnormality. Soft
tissues are unremarkable.
IMPRESSION: Negative.

## 2018-07-23 ENCOUNTER — Other Ambulatory Visit: Payer: Self-pay | Admitting: Physician Assistant

## 2018-07-23 DIAGNOSIS — F419 Anxiety disorder, unspecified: Secondary | ICD-10-CM

## 2018-07-24 NOTE — Telephone Encounter (Signed)
Pt scheduled with AJ 07/31/18 at 4:10 for med refill and he states he has enough to last till appt.

## 2018-07-24 NOTE — Telephone Encounter (Signed)
Controlled substance, needs appt with PCP for refills

## 2018-07-30 ENCOUNTER — Other Ambulatory Visit: Payer: Self-pay

## 2018-07-31 ENCOUNTER — Telehealth: Payer: Self-pay | Admitting: Physician Assistant

## 2018-07-31 ENCOUNTER — Encounter: Payer: Self-pay | Admitting: Physician Assistant

## 2018-07-31 ENCOUNTER — Ambulatory Visit: Payer: BC Managed Care – PPO | Admitting: Family Medicine

## 2018-07-31 ENCOUNTER — Ambulatory Visit: Payer: BC Managed Care – PPO | Admitting: Physician Assistant

## 2018-07-31 VITALS — BP 126/81 | HR 72 | Temp 97.0°F | Ht 69.0 in | Wt 167.4 lb

## 2018-07-31 DIAGNOSIS — F419 Anxiety disorder, unspecified: Secondary | ICD-10-CM

## 2018-07-31 DIAGNOSIS — L509 Urticaria, unspecified: Secondary | ICD-10-CM | POA: Diagnosis not present

## 2018-07-31 MED ORDER — ALPRAZOLAM 0.5 MG PO TABS: 0.5000 mg | ORAL_TABLET | Freq: Two times a day (BID) | ORAL | 5 refills | Status: DC

## 2018-07-31 MED ORDER — CETIRIZINE HCL 10 MG PO TABS: 10.0000 mg | ORAL_TABLET | Freq: Every day | ORAL | 3 refills | Status: DC

## 2018-07-31 MED ORDER — FAMOTIDINE 40 MG PO TABS: 40.0000 mg | ORAL_TABLET | Freq: Every day | ORAL | 3 refills | Status: DC

## 2018-07-31 MED ORDER — METHYLPREDNISOLONE ACETATE 80 MG/ML IJ SUSP
80.0000 mg | Freq: Once | INTRAMUSCULAR | Status: AC
  Administered 2018-07-31: 80 mg via INTRAMUSCULAR

## 2018-07-31 MED ORDER — LORATADINE 10 MG PO TABS: 10.0000 mg | ORAL_TABLET | Freq: Every day | ORAL | 3 refills | Status: DC

## 2018-07-31 NOTE — Telephone Encounter (Signed)
Patient is coming in to see Dr. Darnell Level for the [facility] but wants to know if Glenard Haring will still refill his medications. Please advise and send back to pools

## 2018-07-31 NOTE — Patient Instructions (Signed)
Hives  Hives are itchy, red, swollen areas on your skin. Hives can show up on any part of your body. Hives often fade within 24 hours (acute hives). New hives can show up after old ones fade. This can go on for many days or weeks (chronic hives). Hives do not spread from person to person (are not contagious).  Hives are caused by your body's response to something that you are allergic to (allergen). These are sometimes called triggers. You can get hives right after being around a trigger, or hours later.  What are the causes?  · Allergies to foods.  · Insect bites or stings.  · Pollen.  · Pets.  · Latex.  · Chemicals.  · Spending time in sunlight, heat, or cold.  · Exercise.  · Stress.  · Some medicines.  · Viruses. This includes the common cold.  · Infections caused by germs (bacteria).  · Allergy shots.  · Blood transfusions.  Sometimes, the cause is not known.  What increases the risk?  · Being a woman.  · Being allergic to foods such as:  ? Citrus fruits.  ? Milk.  ? Eggs.  ? Peanuts.  ? Tree nuts.  ? Shellfish.  · Being allergic to:  ? Medicines.  ? Latex.  ? Insects.  ? Animals.  ? Pollen.  What are the signs or symptoms?    · Raised, itchy, red or white bumps or patches on your skin. These areas may:  ? Get large and swollen.  ? Change in shape and location.  ? Stand alone or connect to each other over a large area of skin.  ? Sting or hurt.  ? Turn white when pressed in the center (blanch).  In very bad cases, your hands, feet, and face may also get swollen. This may happen if hives start deeper in your skin.  How is this treated?  Treatment for this condition depends on your symptoms. Treatment may include:  · Using cool, wet cloths (cool compresses) or taking cool showers to stop the itching.  · Medicines that help:  ? Relieve itching (antihistamines).  ? Reduce swelling (corticosteroids).  ? Treat infection (antibiotics).  · A medicine (omalizumab) that is given as a shot (injection). Your doctor may  prescribe this if you have hives that do not get better even after other treatments.  · In very bad cases, you may need a shot of a medicine called epinephrineto prevent a life-threatening allergic reaction (anaphylaxis).  Follow these instructions at home:  Medicines  · Take or apply over-the-counter and prescription medicines only as told by your doctor.  · If you were prescribed an antibiotic medicine, use it as told by your doctor. Do not stop using it even if you start to feel better.  Skin care  · Apply cool, wet cloths to the hives.  · Do not scratch your skin. Do not rub your skin.  General instructions  · Do not take hot showers or baths. This can make itching worse.  · Do not wear tight clothes.  · Use sunscreen and wear clothes that cover your skin when you are outside.  · Avoid any triggers that cause your hives. Keep a journal to help track what causes your hives. Write down:  ? What medicines you take.  ? What you eat and drink.  ? What products you use on your skin.  · Keep all follow-up visits as told by your doctor. This is important.  Contact   a doctor if:  · Your symptoms are not better with medicine.  · Your joints hurt or are swollen.  Get help right away if:  · You have a fever.  · You have pain in your belly (abdomen).  · Your tongue or lips are swollen.  · Your eyelids are swollen.  · Your chest or throat feels tight.  · You have trouble breathing or swallowing.  These symptoms may be an emergency. Do not wait to see if the symptoms will go away. Get medical help right away. Call your local emergency services (911 in the [place] Do not drive yourself to the hospital.  Summary  · Hives are itchy, red, swollen areas on your skin.  · Treatment for this condition depends on your symptoms.  · Avoid things that cause your hives. Keep a journal to help track what causes your hives.  · Take and apply over-the-counter and prescription medicines only as told by your doctor.  · Keep all follow-up visits  as told by your doctor. This is important.  This information is not intended to replace advice given to you by your health care provider. Make sure you discuss any questions you have with your health care provider.  Document Released: 11/14/2007 Document Revised: 08/20/2017 Document Reviewed: 08/20/2017  Elsevier Interactive Patient Education © 2019 [employer]

## 2018-07-31 NOTE — Progress Notes (Signed)
BP 126/81   Pulse 72   Temp (!) 97 F (36.1 C) (Oral)   Ht 5\' 9"  (1.753 m)   Wt 167 lb 6.4 oz (75.9 kg)   BMI 24.72 kg/m    Subjective:    Patient ID: Nathan Walker, male    DOB: 1985-11-30, 33 y.o.   MRN: [id number]  HPI: Nathan Walker is a 33 y.o. male presenting on 07/31/2018 for Urticaria and Medication Refill  This patient comes in for an urgent visit this morning because of outbreak of hives that happened over the past day.  He has never had this problem before.  He states his mother had issues when she was younger.  He states that he has had no new products, no change in detergent, soap, body wash, medications.  The worst is on his face and neck.  There is a significant amount of itching.  He has not taken any medication for it.  He is not really having any episodes from the lower part of the body.  ANXIETY ASSESSMENT Cause of anxiety: GAD This patient returns for a  month recheck on narcotic use for the above named condition(s)  Current medications- alprazolam 0.5 1 tab BID prn anxiety Other medications tried: buspar, SSRI Medication side effects- none Any concerns- no Any change in general medical condition- no Effectiveness of current meds- good PMP AWARE website reviewed: Yes Any suspicious activity on PMP Aware: No LME daily dose: 2, filled 5 times in past year  Contract on file 07/31/18 Last UDS  Obtaining at next visit  History of overdose or risk of abuse no  Past Medical History:  Diagnosis Date  . Anxiety    Relevant past medical, surgical, family and social history reviewed and updated as indicated. Interim medical history since our last visit reviewed. Allergies and medications reviewed and updated. DATA REVIEWED: CHART IN EPIC  Family History reviewed for pertinent findings.  Review of Systems  Constitutional: Negative.  Negative for appetite change and fatigue.  Eyes: Negative for pain and visual disturbance.  Respiratory: Negative.  Negative for  cough, chest tightness, shortness of breath and wheezing.   Cardiovascular: Negative.  Negative for chest pain, palpitations and leg swelling.  Gastrointestinal: Negative.  Negative for abdominal pain, diarrhea, nausea and vomiting.  Genitourinary: Negative.   Skin: Positive for color change and rash.  Neurological: Negative.  Negative for weakness, numbness and headaches.  Psychiatric/Behavioral: Negative.     Allergies as of 07/31/2018   No Known Allergies     Medication List       Accurate as of July 31, 2018  1:21 PM. If you have any questions, ask your nurse or doctor.        STOP taking these medications   cefdinir 300 MG capsule Commonly known as: OMNICEF Stopped by: Remus LofflerAngel S Nadalyn Deringer, PA-C     TAKE these medications   ALPRAZolam 0.5 MG tablet Commonly known as: XANAX Take 1 tablet (0.5 mg total) by mouth 2 (two) times daily.   benzoyl peroxide 5 % external liquid Commonly known as: benzoyl peroxide USE ONCE DAILY FOR WASHING   cetirizine 10 MG tablet Commonly known as: ZYRTEC Take 1 tablet (10 mg total) by mouth at bedtime. Started by: Remus LofflerAngel S Maddisyn Hegwood, PA-C   famotidine 40 MG tablet Commonly known as: Pepcid Take 1 tablet (40 mg total) by mouth daily. Started by: Remus LofflerAngel S Norissa Bartee, PA-C   finasteride 1 MG tablet Commonly known as: PROPECIA Take 1 tablet (  1 mg total) by mouth daily.   loratadine 10 MG tablet Commonly known as: CLARITIN Take 1 tablet (10 mg total) by mouth daily. Started by: Terald Sleeper, PA-C   minocycline 50 MG tablet Commonly known as: DYNACIN Take 1 tablet (50 mg total) by mouth 2 (two) times daily.          Objective:    BP 126/81   Pulse 72   Temp (!) 97 F (36.1 C) (Oral)   Ht 5\' 9"  (1.753 m)   Wt 167 lb 6.4 oz (75.9 kg)   BMI 24.72 kg/m   No Known Allergies  Wt Readings from Last 3 Encounters:  07/31/18 167 lb 6.4 oz (75.9 kg)  01/12/18 165 lb 3.2 oz (74.9 kg)  03/14/17 159 lb 3.2 oz (72.2 kg)    Physical Exam Vitals  signs and nursing note reviewed.  Constitutional:      General: He is not in acute distress.    Appearance: He is well-developed.  HENT:     Head: Normocephalic and atraumatic.  Eyes:     Conjunctiva/sclera: Conjunctivae normal.     Pupils: Pupils are equal, round, and reactive to light.  Cardiovascular:     Rate and Rhythm: Normal rate and regular rhythm.     Heart sounds: Normal heart sounds.  Pulmonary:     Effort: Pulmonary effort is normal. No respiratory distress.     Breath sounds: Normal breath sounds.  Skin:    General: Skin is warm and dry.     Findings: Erythema and rash present. Rash is urticarial.       Psychiatric:        Behavior: Behavior normal.         Assessment & Plan:   1. Urticaria - loratadine (CLARITIN) 10 MG tablet; Take 1 tablet (10 mg total) by mouth daily.  Dispense: 90 tablet; Refill: 3 - cetirizine (ZYRTEC) 10 MG tablet; Take 1 tablet (10 mg total) by mouth at bedtime.  Dispense: 90 tablet; Refill: 3 - famotidine (PEPCID) 40 MG tablet; Take 1 tablet (40 mg total) by mouth daily.  Dispense: 90 tablet; Refill: 3 - methylPREDNISolone acetate (DEPO-MEDROL) injection 80 mg  2. Anxiety - ALPRAZolam (XANAX) 0.5 MG tablet; Take 1 tablet (0.5 mg total) by mouth 2 (two) times daily.  Dispense: 60 tablet; Refill: 5   Continue all other maintenance medications as listed above.  Follow up plan: 6 months or sooner if needed  Educational handout given for urticaria  Terald Sleeper PA-C [facility] [address]  [place], [place] [address] [phone]   07/31/2018, 1:21 PM

## 2018-07-31 NOTE — Telephone Encounter (Signed)
Refills sent on the meds that needed refills. Some already had refills until 11/20. Was there something else missing?  He can be taken off Gotschalk's schedule

## 2018-07-31 NOTE — Telephone Encounter (Signed)
Aware and apt has been canceled.

## 2018-10-08 DIAGNOSIS — Z20828 Contact with and (suspected) exposure to other viral communicable diseases: Secondary | ICD-10-CM | POA: Diagnosis not present

## 2018-10-09 ENCOUNTER — Ambulatory Visit (INDEPENDENT_AMBULATORY_CARE_PROVIDER_SITE_OTHER): Payer: BC Managed Care – PPO | Admitting: Physician Assistant

## 2018-10-09 ENCOUNTER — Encounter: Payer: Self-pay | Admitting: Physician Assistant

## 2018-10-09 DIAGNOSIS — F320 Major depressive disorder, single episode, mild: Secondary | ICD-10-CM

## 2018-10-09 DIAGNOSIS — F419 Anxiety disorder, unspecified: Secondary | ICD-10-CM

## 2018-10-09 DIAGNOSIS — L509 Urticaria, unspecified: Secondary | ICD-10-CM | POA: Diagnosis not present

## 2018-10-09 MED ORDER — ESCITALOPRAM OXALATE 10 MG PO TABS: 10.0000 mg | ORAL_TABLET | Freq: Every day | ORAL | 5 refills | Status: DC

## 2018-10-09 NOTE — Progress Notes (Signed)
Telephone visit  Subjective: Nathan Walker, depression, urticaria PCP: Terald Sleeper, PA-C BOF:BPZWC Nathan Walker is a 33 y.o. male calls for telephone consult today. Patient provides verbal consent for consult held via phone.  Patient is identified with 2 separate identifiers.  At this time the entire area is on COVID-19 social distancing and stay home orders are in place.  Patient is of higher risk and therefore we are performing this by a virtual method.  Location of patient: home Location of provider: HOME Others present for call: no  The patient reports that he had a lot of days where he is been very down and very stressed out.  He denies any particular event but just overwhelming from all of things that are going on.  His PHQ is positive.  He is also having a flareup of his urticaria.  We will have him come in next week for an injection of Depo-Medrol 80 mg.  Depression screen [facility] 2/9 10/09/2018 07/31/2018 01/12/2018 03/14/2017 07/08/2016  Decreased Interest 1 0 1 1 0  Down, Depressed, Hopeless 1 0 1 1 0  PHQ - 2 Score 2 0 2 2 0  Altered sleeping 2 - 1 2 -  Tired, decreased energy 2 - 1 1 -  Change in appetite 1 - 1 1 -  Feeling bad or failure about yourself  1 - 0 0 -  Trouble concentrating 1 - 0 0 -  Moving slowly or fidgety/restless 1 - 0 0 -  Suicidal thoughts 0 - 0 0 -  PHQ-9 Score 10 - 5 6 -  Difficult doing work/chores Somewhat difficult - - - -    ANXIETY ASSESSMENT Cause of anxiety: GAD This patient returns for a  month recheck on narcotic use for the above named condition(s)  Current medications- alprazolam 0.5 1 tab BID prn anxiety Other medications tried: buspar, SSRI Medication side effects- none Any concerns- no Any change in general medical condition- no Effectiveness of current meds- good PMP AWARE website reviewed: Yes Any suspicious activity on PMP Aware: No LME daily dose: 2, filled 5 times in past year  Contract on file 07/31/18 Last UDS   Obtaining at next visit  History of overdose or risk of abuse no  ROS: Per HPI  No Known Allergies Past Medical History:  Diagnosis Date  . Anxiety     Current Outpatient Medications:  .  ALPRAZolam (XANAX) 0.5 MG tablet, Take 1 tablet (0.5 mg total) by mouth 2 (two) times daily., Disp: 60 tablet, Rfl: 5 .  benzoyl peroxide (BENZOYL PEROXIDE) 5 % external liquid, USE ONCE DAILY FOR WASHING, Disp: 113 g, Rfl: 1 .  cetirizine (ZYRTEC) 10 MG tablet, Take 1 tablet (10 mg total) by mouth at bedtime., Disp: 90 tablet, Rfl: 3 .  escitalopram (LEXAPRO) 10 MG tablet, Take 1 tablet (10 mg total) by mouth daily., Disp: 30 tablet, Rfl: 5 .  famotidine (PEPCID) 40 MG tablet, Take 1 tablet (40 mg total) by mouth daily., Disp: 90 tablet, Rfl: 3 .  finasteride (PROPECIA) 1 MG tablet, Take 1 tablet (1 mg total) by mouth daily., Disp: 30 tablet, Rfl: 11 .  loratadine (CLARITIN) 10 MG tablet, Take 1 tablet (10 mg total) by mouth daily., Disp: 90 tablet, Rfl: 3 .  minocycline (DYNACIN) 50 MG tablet, Take 1 tablet (50 mg total) by mouth 2 (two) times daily., Disp: 60 tablet, Rfl: 11  Assessment/ Plan: 33 y.o. male   1. Current mild episode of major  depressive disorder without prior episode (HCC) - escitalopram (LEXAPRO) 10 MG tablet; Take 1 tablet (10 mg total) by mouth daily.  Dispense: 30 tablet; Refill: 5  2. Urticaria depomedrol 80 mg one daily  3. Anxiety - escitalopram (LEXAPRO) 10 MG tablet; Take 1 tablet (10 mg total) by mouth daily.  Dispense: 30 tablet; Refill: 5   Return in about 4 weeks (around 11/06/2018) for recheck medications.  Continue all other maintenance medications as listed above.  Start time: 8:20 AM End time: 8:38 AM  Meds ordered this encounter  Medications  . escitalopram (LEXAPRO) 10 MG tablet    Sig: Take 1 tablet (10 mg total) by mouth daily.    Dispense:  30 tablet    Refill:  5    Order Specific Question:   Supervising Provider    Answer:   Raliegh IpGOTTSCHALK, ASHLY  M [[id number]]    Prudy FeelerAngel Oluwatoni Rotunno PA-C [facility] [phone]

## 2018-10-11 DIAGNOSIS — F320 Major depressive disorder, single episode, mild: Secondary | ICD-10-CM | POA: Insufficient documentation

## 2018-10-13 MED ORDER — METHYLPREDNISOLONE ACETATE 80 MG/ML IJ SUSP
80.0000 mg | Freq: Once | INTRAMUSCULAR | Status: AC
  Administered 2018-10-16: 80 mg via INTRAMUSCULAR

## 2018-10-13 NOTE — Progress Notes (Signed)
Patient scheduled to come Friday for depomedrol

## 2018-10-15 ENCOUNTER — Other Ambulatory Visit: Payer: Self-pay

## 2018-10-16 ENCOUNTER — Ambulatory Visit (INDEPENDENT_AMBULATORY_CARE_PROVIDER_SITE_OTHER): Payer: BC Managed Care – PPO | Admitting: *Deleted

## 2018-10-16 DIAGNOSIS — L509 Urticaria, unspecified: Secondary | ICD-10-CM | POA: Diagnosis not present

## 2018-11-06 ENCOUNTER — Other Ambulatory Visit: Payer: Self-pay | Admitting: Physician Assistant

## 2018-11-06 DIAGNOSIS — F320 Major depressive disorder, single episode, mild: Secondary | ICD-10-CM

## 2018-11-06 DIAGNOSIS — F419 Anxiety disorder, unspecified: Secondary | ICD-10-CM

## 2018-11-13 ENCOUNTER — Encounter: Payer: Self-pay | Admitting: Physician Assistant

## 2018-11-13 ENCOUNTER — Ambulatory Visit (INDEPENDENT_AMBULATORY_CARE_PROVIDER_SITE_OTHER): Payer: BC Managed Care – PPO | Admitting: Physician Assistant

## 2018-11-13 ENCOUNTER — Other Ambulatory Visit: Payer: Self-pay

## 2018-11-13 DIAGNOSIS — F419 Anxiety disorder, unspecified: Secondary | ICD-10-CM | POA: Diagnosis not present

## 2018-11-13 DIAGNOSIS — F320 Major depressive disorder, single episode, mild: Secondary | ICD-10-CM | POA: Diagnosis not present

## 2018-11-13 MED ORDER — ALPRAZOLAM 1 MG PO TABS: 1.0000 mg | ORAL_TABLET | Freq: Two times a day (BID) | ORAL | 5 refills | Status: DC | PRN

## 2018-11-15 NOTE — Progress Notes (Signed)
Telephone visit  Subjective: ZJ:IRCVELF depression PCP: Terald Sleeper, PA-C YBO:FBPZW D Hemp is a 33 y.o. male calls for telephone consult today. Patient provides verbal consent for consult held via phone.  Patient is identified with 2 separate identifiers.  At this time the entire area is on COVID-19 social distancing and stay home orders are in place.  Patient is of higher risk and therefore we are performing this by a virtual method.  Location of patient: home Location of provider: [facility] Others present for call: no  This patient has a follow-up for his depression anxiety.  He states he does feel little bit better.  However he had a couple of days where he felt, down in the last week.  He was concerned that the medication could be doing it.  We have discussed him reducing his Lexapro to half a dose for a couple of weeks and then to increase it back up to the 10 mg.  His PHQ does show that things are improved.  Depression screen [facility] 2/9 11/13/2018 10/09/2018 07/31/2018 01/12/2018 03/14/2017  Decreased Interest 1 1 0 1 1  Down, Depressed, Hopeless 1 1 0 1 1  PHQ - 2 Score 2 2 0 2 2  Altered sleeping 1 2 - 1 2  Tired, decreased energy 1 2 - 1 1  Change in appetite 0 1 - 1 1  Feeling bad or failure about yourself  1 1 - 0 0  Trouble concentrating 0 1 - 0 0  Moving slowly or fidgety/restless 1 1 - 0 0  Suicidal thoughts 0 0 - 0 0  PHQ-9 Score 6 10 - 5 6  Difficult doing work/chores Somewhat difficult Somewhat difficult - - -      ROS: Per HPI  No Known Allergies Past Medical History:  Diagnosis Date  . Anxiety     Current Outpatient Medications:  .  ALPRAZolam (XANAX) 1 MG tablet, Take 1 tablet (1 mg total) by mouth 2 (two) times daily as needed for anxiety., Disp: 30 tablet, Rfl: 5 .  benzoyl peroxide (BENZOYL PEROXIDE) 5 % external liquid, USE ONCE DAILY FOR WASHING, Disp: 113 g, Rfl: 1 .  cetirizine (ZYRTEC) 10 MG tablet, Take 1 tablet (10 mg total) by mouth at  bedtime., Disp: 90 tablet, Rfl: 3 .  escitalopram (LEXAPRO) 10 MG tablet, TAKE 1 TABLET BY MOUTH EVERY DAY, Disp: 90 tablet, Rfl: 2 .  famotidine (PEPCID) 40 MG tablet, Take 1 tablet (40 mg total) by mouth daily., Disp: 90 tablet, Rfl: 3 .  finasteride (PROPECIA) 1 MG tablet, Take 1 tablet (1 mg total) by mouth daily., Disp: 30 tablet, Rfl: 11 .  loratadine (CLARITIN) 10 MG tablet, Take 1 tablet (10 mg total) by mouth daily., Disp: 90 tablet, Rfl: 3 .  minocycline (DYNACIN) 50 MG tablet, Take 1 tablet (50 mg total) by mouth 2 (two) times daily., Disp: 60 tablet, Rfl: 11  Assessment/ Plan: 33 y.o. male   1. Current mild episode of major depressive disorder without prior episode (HCC) Continue Lexapro 10 mg 1 daily  2. Anxiety - ALPRAZolam (XANAX) 1 MG tablet; Take 1 tablet (1 mg total) by mouth 2 (two) times daily as needed for anxiety.  Dispense: 30 tablet; Refill: 5   Return in about 4 weeks (around 12/11/2018).  Continue all other maintenance medications as listed above.  Start time: 1:18 PM End time: 1:29 PM  Meds ordered this encounter  Medications  . ALPRAZolam (XANAX) 1 MG  tablet    Sig: Take 1 tablet (1 mg total) by mouth 2 (two) times daily as needed for anxiety.    Dispense:  30 tablet    Refill:  5    Not to exceed 5 additional fills before 04/11/2018    Order Specific Question:   Supervising Provider    Answer:   Raliegh Ip [[id number]]    Prudy Feeler PA-C [facility] [phone]

## 2019-01-01 ENCOUNTER — Other Ambulatory Visit: Payer: Self-pay | Admitting: Physician Assistant

## 2019-01-31 ENCOUNTER — Other Ambulatory Visit: Payer: Self-pay | Admitting: Physician Assistant

## 2019-01-31 DIAGNOSIS — L659 Nonscarring hair loss, unspecified: Secondary | ICD-10-CM

## 2019-02-01 NOTE — Telephone Encounter (Signed)
Last office visit 11/13/2018

## 2019-04-21 DIAGNOSIS — Z23 Encounter for immunization: Secondary | ICD-10-CM | POA: Diagnosis not present

## 2019-04-30 ENCOUNTER — Other Ambulatory Visit: Payer: Self-pay | Admitting: Physician Assistant

## 2019-04-30 DIAGNOSIS — L509 Urticaria, unspecified: Secondary | ICD-10-CM

## 2019-05-08 DIAGNOSIS — S0100XA Unspecified open wound of scalp, initial encounter: Secondary | ICD-10-CM | POA: Diagnosis not present

## 2019-05-12 DIAGNOSIS — Z23 Encounter for immunization: Secondary | ICD-10-CM | POA: Diagnosis not present

## 2019-05-13 DIAGNOSIS — S0990XD Unspecified injury of head, subsequent encounter: Secondary | ICD-10-CM | POA: Diagnosis not present

## 2019-05-13 DIAGNOSIS — S0100XD Unspecified open wound of scalp, subsequent encounter: Secondary | ICD-10-CM | POA: Diagnosis not present

## 2019-05-14 DIAGNOSIS — S0003XA Contusion of scalp, initial encounter: Secondary | ICD-10-CM | POA: Diagnosis not present

## 2019-05-14 DIAGNOSIS — S0990XA Unspecified injury of head, initial encounter: Secondary | ICD-10-CM | POA: Diagnosis not present

## 2019-05-14 DIAGNOSIS — W19XXXA Unspecified fall, initial encounter: Secondary | ICD-10-CM | POA: Diagnosis not present

## 2019-05-14 DIAGNOSIS — Z23 Encounter for immunization: Secondary | ICD-10-CM | POA: Diagnosis not present

## 2019-05-14 DIAGNOSIS — S0101XA Laceration without foreign body of scalp, initial encounter: Secondary | ICD-10-CM | POA: Diagnosis not present

## 2019-05-14 DIAGNOSIS — S0100XA Unspecified open wound of scalp, initial encounter: Secondary | ICD-10-CM | POA: Diagnosis not present

## 2019-07-21 ENCOUNTER — Other Ambulatory Visit: Payer: Self-pay

## 2019-07-21 DIAGNOSIS — F419 Anxiety disorder, unspecified: Secondary | ICD-10-CM

## 2019-07-21 NOTE — Telephone Encounter (Signed)
Patient needs to be seen for refills

## 2019-07-22 NOTE — Telephone Encounter (Signed)
Lmtcb ac 06/03

## 2019-07-23 ENCOUNTER — Other Ambulatory Visit: Payer: Self-pay | Admitting: *Deleted

## 2019-07-23 DIAGNOSIS — F419 Anxiety disorder, unspecified: Secondary | ICD-10-CM

## 2019-08-18 ENCOUNTER — Other Ambulatory Visit: Payer: Self-pay

## 2019-08-18 DIAGNOSIS — F320 Major depressive disorder, single episode, mild: Secondary | ICD-10-CM

## 2019-08-18 DIAGNOSIS — F419 Anxiety disorder, unspecified: Secondary | ICD-10-CM

## 2019-08-18 NOTE — Telephone Encounter (Signed)
Escitalopram denied Patient's last office visit 11/13/2018 Patient needs to be seen

## 2019-08-19 NOTE — Telephone Encounter (Signed)
Left detailed message will have to make appointment for refills.

## 2019-10-14 DIAGNOSIS — F329 Major depressive disorder, single episode, unspecified: Secondary | ICD-10-CM | POA: Diagnosis not present

## 2019-10-14 DIAGNOSIS — L649 Androgenic alopecia, unspecified: Secondary | ICD-10-CM | POA: Diagnosis not present

## 2019-10-14 DIAGNOSIS — F419 Anxiety disorder, unspecified: Secondary | ICD-10-CM | POA: Diagnosis not present

## 2019-10-14 DIAGNOSIS — L508 Other urticaria: Secondary | ICD-10-CM | POA: Diagnosis not present

## 2020-01-05 DIAGNOSIS — F410 Panic disorder [episodic paroxysmal anxiety] without agoraphobia: Secondary | ICD-10-CM | POA: Diagnosis not present

## 2020-02-14 ENCOUNTER — Other Ambulatory Visit: Payer: Self-pay | Admitting: *Deleted

## 2020-03-03 DIAGNOSIS — H5203 Hypermetropia, bilateral: Secondary | ICD-10-CM | POA: Diagnosis not present

## 2020-03-03 DIAGNOSIS — H52223 Regular astigmatism, bilateral: Secondary | ICD-10-CM | POA: Diagnosis not present

## 2020-04-20 DIAGNOSIS — F331 Major depressive disorder, recurrent, moderate: Secondary | ICD-10-CM | POA: Diagnosis not present

## 2020-04-20 DIAGNOSIS — F410 Panic disorder [episodic paroxysmal anxiety] without agoraphobia: Secondary | ICD-10-CM | POA: Diagnosis not present

## 2020-04-20 DIAGNOSIS — L700 Acne vulgaris: Secondary | ICD-10-CM | POA: Diagnosis not present

## 2020-08-17 DIAGNOSIS — F419 Anxiety disorder, unspecified: Secondary | ICD-10-CM | POA: Diagnosis not present

## 2020-08-17 DIAGNOSIS — F410 Panic disorder [episodic paroxysmal anxiety] without agoraphobia: Secondary | ICD-10-CM | POA: Diagnosis not present

## 2020-08-17 DIAGNOSIS — L508 Other urticaria: Secondary | ICD-10-CM | POA: Diagnosis not present

## 2020-08-17 DIAGNOSIS — F331 Major depressive disorder, recurrent, moderate: Secondary | ICD-10-CM | POA: Diagnosis not present

## 2020-11-30 DIAGNOSIS — Z23 Encounter for immunization: Secondary | ICD-10-CM | POA: Diagnosis not present

## 2020-11-30 DIAGNOSIS — F410 Panic disorder [episodic paroxysmal anxiety] without agoraphobia: Secondary | ICD-10-CM | POA: Diagnosis not present

## 2021-02-05 ENCOUNTER — Other Ambulatory Visit: Payer: Self-pay | Admitting: *Deleted

## 2021-02-05 DIAGNOSIS — L659 Nonscarring hair loss, unspecified: Secondary | ICD-10-CM

## 2021-05-17 ENCOUNTER — Ambulatory Visit: Payer: BC Managed Care – PPO | Admitting: Family Medicine

## 2021-05-17 ENCOUNTER — Encounter: Payer: Self-pay | Admitting: Family Medicine

## 2021-05-17 VITALS — BP 125/84 | HR 102 | Temp 98.5°F | Ht 69.0 in | Wt 155.5 lb

## 2021-05-17 DIAGNOSIS — F411 Generalized anxiety disorder: Secondary | ICD-10-CM

## 2021-05-17 DIAGNOSIS — F339 Major depressive disorder, recurrent, unspecified: Secondary | ICD-10-CM | POA: Diagnosis not present

## 2021-05-17 DIAGNOSIS — Z79891 Long term (current) use of opiate analgesic: Secondary | ICD-10-CM | POA: Diagnosis not present

## 2021-05-17 DIAGNOSIS — F1320 Sedative, hypnotic or anxiolytic dependence, uncomplicated: Secondary | ICD-10-CM

## 2021-05-17 DIAGNOSIS — Z79899 Other long term (current) drug therapy: Secondary | ICD-10-CM | POA: Diagnosis not present

## 2021-05-17 MED ORDER — ALPRAZOLAM 1 MG PO TABS: 1.0000 mg | ORAL_TABLET | Freq: Two times a day (BID) | ORAL | 0 refills | Status: DC | PRN

## 2021-05-17 MED ORDER — CITALOPRAM HYDROBROMIDE 20 MG PO TABS: 20.0000 mg | ORAL_TABLET | Freq: Every day | ORAL | 3 refills | Status: DC

## 2021-05-17 NOTE — Progress Notes (Signed)
? ?Established Patient Office Visit ? ?Subjective:  ?Patient ID: Nathan Walker, male    DOB: [birth year]  Age: 36 y.o. MRN: [id number] ? ?CC:  ?Chief Complaint  ?Patient presents with  ? Establish Care  ? ? ?HPI ?Nathan Walker presents to re-establish care. He has been living in [place] for the last few years. He just ended a 8 year relationship 2 days ago and is working on moving back to this area to be closer to family. He has a history of anxiety and depression. He has taken xanax regularly for this for numerous years. He reports that his symptoms have been worse since the breakup. He denies a plan to harm himself or others and states that he "would never do that". He felt like his previous provider was not helpful and did not really try to treat him and rather just saw him for a few minutes to provide refills. He was previously on lexapro but did not like the side effects. He has not tried any other medications for anxiety or depression and would like to do so. He reports panic attacks frequently.  ? ? ?  05/17/2021  ? 11:59 AM 11/13/2018  ?  1:20 PM 10/09/2018  ?  9:57 AM  ?Depression screen PHQ 2/9  ?Decreased Interest 1 1 1   ?Down, Depressed, Hopeless 2 1 1   ?PHQ - 2 Score 3 2 2   ?Altered sleeping 1 1 2   ?Tired, decreased energy 1 1 2   ?Change in appetite 1 0 1  ?Feeling bad or failure about yourself  1 1 1   ?Trouble concentrating 1 0 1  ?Moving slowly or fidgety/restless 0 1 1  ?Suicidal thoughts 1 0 0  ?PHQ-9 Score 9 6 10   ?Difficult doing work/chores Somewhat difficult Somewhat difficult Somewhat difficult  ? ? ?  05/17/2021  ? 12:00 PM  ?GAD 7 : Generalized Anxiety Score  ?Nervous, Anxious, on Edge 3  ?Control/stop worrying 3  ?Worry too much - different things 3  ?Trouble relaxing 3  ?Restless 2  ?Easily annoyed or irritable 2  ?Afraid - awful might happen 2  ?Total GAD 7 Score 18  ?Anxiety Difficulty Very difficult  ? ? ? ? ?Past Medical History:  ?Diagnosis Date  ? Anxiety   ? ? ?Past Surgical History:   ?Procedure Laterality Date  ? WISDOM TOOTH EXTRACTION    ? ? ?No family history on file. ? ?Social History  ? ?Socioeconomic History  ? Marital status: Single  ?  Spouse name: Not on file  ? Number of children: Not on file  ? Years of education: Not on file  ? Highest education level: Not on file  ?Occupational History  ? Not on file  ?Tobacco Use  ? Smoking status: Never  ? Smokeless tobacco: Never  ?Vaping Use  ? Vaping Use: Never used  ?Substance and Sexual Activity  ? Alcohol use: Yes  ?  Comment: occasionally  ? Drug use: No  ? Sexual activity: Not on file  ?Other Topics Concern  ? Not on file  ?Social History Narrative  ? Not on file  ? ?Social Determinants of Health  ? ?Financial Resource Strain: Not on file  ?Food Insecurity: Not on file  ?Transportation Needs: Not on file  ?Physical Activity: Not on file  ?Stress: Not on file  ?Social Connections: Not on file  ?Intimate Partner Violence: Not on file  ? ? ?Outpatient Medications Prior to Visit  ?Medication Sig Dispense Refill  ? ALPRAZolam (  XANAX) 1 MG tablet Take 1 tablet (1 mg total) by mouth 2 (two) times daily as needed for anxiety. 30 tablet 5  ? cetirizine (ZYRTEC) 10 MG tablet Take 1 tablet (10 mg total) by mouth at bedtime. 90 tablet 3  ? famotidine (PEPCID) 40 MG tablet Take 1 tablet (40 mg total) by mouth daily. 90 tablet 3  ? finasteride (PROPECIA) 1 MG tablet TAKE 1 TABLET BY MOUTH EVERY DAY 30 tablet 11  ? loratadine (CLARITIN) 10 MG tablet TAKE 1 TABLET BY MOUTH EVERY DAY 90 tablet 1  ? minocycline (MINOCIN) 50 MG capsule TAKE 1 TABLET BY MOUTH 2 TIMES DAILY 60 capsule 11  ? benzoyl peroxide (BENZOYL PEROXIDE) 5 % external liquid USE ONCE DAILY FOR WASHING 113 g 1  ? escitalopram (LEXAPRO) 10 MG tablet TAKE 1 TABLET BY MOUTH EVERY DAY 90 tablet 2  ? minocycline (DYNACIN) 50 MG tablet Take 1 tablet (50 mg total) by mouth 2 (two) times daily. 60 tablet 11  ? ?No facility-administered medications prior to visit.  ? ? ?No Known  Allergies ? ?ROS ?Review of Systems ?Negative unless specially indicated above in HPI. ?  ?Objective:  ?  ?Physical Exam ?Vitals and nursing note reviewed.  ?Constitutional:   ?   General: He is not in acute distress. ?   Appearance: He is not ill-appearing, toxic-appearing or diaphoretic.  ?Cardiovascular:  ?   Rate and Rhythm: Normal rate and regular rhythm.  ?   Heart sounds: Normal heart sounds. No murmur heard. ?Pulmonary:  ?   Effort: Pulmonary effort is normal. No respiratory distress.  ?   Breath sounds: Normal breath sounds.  ?Abdominal:  ?   General: Bowel sounds are normal. There is no distension.  ?   Palpations: Abdomen is soft.  ?   Tenderness: There is no abdominal tenderness. There is no guarding or rebound.  ?Musculoskeletal:  ?   Right lower leg: No edema.  ?   Left lower leg: No edema.  ?Skin: ?   General: Skin is warm and dry.  ?Neurological:  ?   General: No focal deficit present.  ?   Mental Status: He is alert and oriented to person, place, and time.  ?Psychiatric:     ?   Attention and Perception: Attention normal.     ?   Mood and Affect: Mood is anxious.     ?   Speech: Speech normal.     ?   Behavior: Behavior normal.     ?   Thought Content: Thought content normal.     ?   Cognition and Memory: Cognition and memory normal.  ? ? ?BP 125/84   Pulse (!) 102   Temp 98.5 ?F (36.9 ?C) (Temporal)   Ht 5\' 9"  (1.753 m)   Wt 155 lb 8 oz (70.5 kg)   BMI 22.96 kg/m?  ?Wt Readings from Last 3 Encounters:  ?05/17/21 155 lb 8 oz (70.5 kg)  ?07/31/18 167 lb 6.4 oz (75.9 kg)  ?01/12/18 165 lb 3.2 oz (74.9 kg)  ? ? ? ?There are no preventive care reminders to display for this patient. ? ?There are no preventive care reminders to display for this patient. ? ?No results found for: TSH ?No results found for: WBC, HGB, HCT, MCV, PLT ?Lab Results  ?Component Value Date  ? NA 138 07/24/2008  ? K 3.7 07/24/2008  ? CO2 26 07/24/2008  ? GLUCOSE 95 07/24/2008  ? BUN 8 07/24/2008  ? CREATININE  1.00 07/24/2008  ?  BILITOT 1.1 07/24/2008  ? ALKPHOS 92 07/24/2008  ? AST 53 (H) 07/24/2008  ? ALT 54 (H) 07/24/2008  ? PROT 8.0 07/24/2008  ? ALBUMIN 4.5 07/24/2008  ? CALCIUM 9.8 07/24/2008  ? ?No results found for: CHOL ?No results found for: HDL ?No results found for: LDLCALC ?No results found for: TRIG ?No results found for: CHOLHDL ?No results found for: HGBA1C ? ?  ?Assessment & Plan:  ? ?Gerilyn PilgrimJacob was seen today for establish care. ? ?Diagnoses and all orders for this visit: ? ?Depression, recurrent (HCC) ?Uncontrolled. Verbal safety plan in place. Start Celexa as below.  ?-     citalopram (CELEXA) 20 MG tablet; Take 1 tablet (20 mg total) by mouth daily. ? ?Generalized anxiety disorder ?Uncontrolled. Start celexa. Has been on xanax regularly for many years. CSA and UDS today. PDMP not avaliale as patient recently moved from [place]. Xanax sent in today for 30 tablets and 0 refills. Discussed further refills pending UDS results and follow up.  ?-     ToxASSURE Select 13 (MW), Urine ?-     citalopram (CELEXA) 20 MG tablet; Take 1 tablet (20 mg total) by mouth daily. ?-     ALPRAZolam (XANAX) 1 MG tablet; Take 1 tablet (1 mg total) by mouth 2 (two) times daily as needed for anxiety. ? ?Benzodiazepine dependence (HCC) ?-     ToxASSURE Select 13 (MW), Urine ?-     ALPRAZolam (XANAX) 1 MG tablet; Take 1 tablet (1 mg total) by mouth 2 (two) times daily as needed for anxiety. ? ?Controlled substance agreement signed ?-     ToxASSURE Select 13 (MW), Urine ?-     ALPRAZolam (XANAX) 1 MG tablet; Take 1 tablet (1 mg total) by mouth 2 (two) times daily as needed for anxiety. ? ?Follow-up: Return in about 4 weeks (around 06/14/2021) for anxiety.  ? ?The patient indicates understanding of these issues and agrees with the plan. ? ?Gabriel Earingiffany M Saleemah Mollenhauer, FNP ?

## 2021-05-17 NOTE — Patient Instructions (Signed)
Managing Anxiety, Adult ?After being diagnosed with anxiety, you may be relieved to know why you have felt or behaved a certain way. You may also feel overwhelmed about the treatment ahead and what it will mean for your life. With care and support, you can manage this condition. ?How to manage lifestyle changes ?Managing stress and anxiety ?Stress is your body's reaction to life changes and events, both good and bad. Most stress will last just a few hours, but stress can be ongoing and can lead to more than just stress. Although stress can play a major role in anxiety, it is not the same as anxiety. Stress is usually caused by something external, such as a deadline, test, or competition. Stress normally passes after the triggering event has ended.  ?Anxiety is caused by something internal, such as imagining a terrible outcome or worrying that something will go wrong that will devastate you. Anxiety often does not go away even after the triggering event is over, and it can become long-term (chronic) worry. It is important to understand the differences between stress and anxiety and to manage your stress effectively so that it does not lead to an anxious response. ?Talk with your health care provider or a counselor to learn more about reducing anxiety and stress. He or she may suggest tension reduction techniques, such as: ?Music therapy. Spend time creating or listening to music that you enjoy and that inspires you. ?Mindfulness-based meditation. Practice being aware of your normal breaths while not trying to control your breathing. It can be done while sitting or walking. ?Centering prayer. This involves focusing on a word, phrase, or sacred image that means something to you and brings you peace. ?Deep breathing. To do this, expand your stomach and inhale slowly through your nose. Hold your breath for 3-5 seconds. Then exhale slowly, letting your stomach muscles relax. ?Self-talk. Learn to notice and identify  thought patterns that lead to anxiety reactions and change those patterns to thoughts that feel peaceful. ?Muscle relaxation. Taking time to tense muscles and then relax them. ?Choose a tension reduction technique that fits your lifestyle and personality. These techniques take time and practice. Set aside 5-15 minutes a day to do them. Therapists can offer counseling and training in these techniques. The training to help with anxiety may be covered by some insurance plans. ?Other things you can do to manage stress and anxiety include: ?Keeping a stress diary. This can help you learn what triggers your reaction and then learn ways to manage your response. ?Thinking about how you react to certain situations. You may not be able to control everything, but you can control your response. ?Making time for activities that help you relax and not feeling guilty about spending your time in this way. ?Doing visual imagery. This involves imagining or creating mental pictures to help you relax. ?Practicing yoga. Through yoga poses, you can lower tension and promote relaxation. ? ?Medicines ?Medicines can help ease symptoms. Medicines for anxiety include: ?Antidepressant medicines. These are usually prescribed for long-term daily control. ?Anti-anxiety medicines. These may be added in severe cases, especially when panic attacks occur. ?Medicines will be prescribed by a health care provider. When used together, medicines, psychotherapy, and tension reduction techniques may be the most effective treatment. ?Relationships ?Relationships can play a big part in helping you recover. Try to spend more time connecting with trusted friends and family members. ?Consider going to couples counseling if you have a partner, taking family education classes, or going to family   therapy. ?Therapy can help you and others better understand your condition. ?How to recognize changes in your anxiety ?Everyone responds differently to treatment for  anxiety. Recovery from anxiety happens when symptoms decrease and stop interfering with your daily activities at home or work. This may mean that you will start to: ?Have better concentration and focus. Worry will interfere less in your daily thinking. ?Sleep better. ?Be less irritable. ?Have more energy. ?Have improved memory. ?It is also important to recognize when your condition is getting worse. Contact your health care provider if your symptoms interfere with home or work and you feel like your condition is not improving. ?Follow these instructions at home: ?Activity ?Exercise. Adults should do the following: ?Exercise for at least 150 minutes each week. The exercise should increase your heart rate and make you sweat (moderate-intensity exercise). ?Strengthening exercises at least twice a week. ?Get the right amount and quality of sleep. Most adults need 7-9 hours of sleep each night. ?Lifestyle ? ?Eat a healthy diet that includes plenty of vegetables, fruits, whole grains, low-fat dairy products, and lean protein. ?Do not eat a lot of foods that are high in fats, added sugars, or salt (sodium). ?Make choices that simplify your life. ?Do not use any products that contain nicotine or tobacco. These products include cigarettes, chewing tobacco, and vaping devices, such as e-cigarettes. If you need help quitting, ask your health care provider. ?Avoid caffeine, alcohol, and certain over-the-counter cold medicines. These may make you feel worse. Ask your pharmacist which medicines to avoid. ?General instructions ?Take over-the-counter and prescription medicines only as told by your health care provider. ?Keep all follow-up visits. This is important. ?Where to find support ?You can get help and support from these sources: ?Self-help groups. ?Online and community organizations. ?A trusted spiritual leader. ?Couples counseling. ?Family education classes. ?Family therapy. ?Where to find more information ?You may find  that joining a support group helps you deal with your anxiety. The following sources can help you locate counselors or support groups near you: ?Mental Health America: www.mentalhealthamerica.net ?Anxiety and Depression Association of America (ADAA): www.adaa.org ?National Alliance on Mental Illness (NAMI): www.nami.org ?Contact a health care provider if: ?You have a hard time staying focused or finishing daily tasks. ?You spend many hours a day feeling worried about everyday life. ?You become exhausted by worry. ?You start to have headaches or frequently feel tense. ?You develop chronic nausea or diarrhea. ?Get help right away if: ?You have a racing heart and shortness of breath. ?You have thoughts of hurting yourself or others. ?If you ever feel like you may hurt yourself or others, or have thoughts about taking your own life, get help right away. Go to your nearest emergency department or: ?Call your local emergency services (911 in the [place] ?Call a suicide crisis helpline, such as the National Suicide Prevention Lifeline at 1-800-273-8255 or 988 in the [place] This is open 24 hours a day in the [place] ?Text the Crisis Text Line at 741741 (in the [place] ?Summary ?Taking steps to learn and use tension reduction techniques can help calm you and help prevent triggering an anxiety reaction. ?When used together, medicines, psychotherapy, and tension reduction techniques may be the most effective treatment. ?Family, friends, and partners can play a big part in supporting you. ?This information is not intended to replace advice given to you by your health care provider. Make sure you discuss any questions you have with your health care provider. ?Document Revised: 08/30/2020 Document Reviewed: 05/28/2020 ?Elsevier Patient   Education ? 2022 Elsevier Inc. ? ?

## 2021-05-23 LAB — TOXASSURE SELECT 13 (MW), URINE

## 2021-05-31 ENCOUNTER — Ambulatory Visit: Payer: BC Managed Care – PPO | Admitting: Family Medicine

## 2021-05-31 ENCOUNTER — Encounter: Payer: Self-pay | Admitting: Family Medicine

## 2021-05-31 VITALS — BP 126/91 | HR 82 | Ht 69.0 in | Wt 155.0 lb

## 2021-05-31 DIAGNOSIS — F1320 Sedative, hypnotic or anxiolytic dependence, uncomplicated: Secondary | ICD-10-CM

## 2021-05-31 DIAGNOSIS — F411 Generalized anxiety disorder: Secondary | ICD-10-CM | POA: Diagnosis not present

## 2021-05-31 DIAGNOSIS — F339 Major depressive disorder, recurrent, unspecified: Secondary | ICD-10-CM

## 2021-05-31 DIAGNOSIS — Z79899 Other long term (current) drug therapy: Secondary | ICD-10-CM

## 2021-05-31 MED ORDER — ALPRAZOLAM 1 MG PO TABS: 1.0000 mg | ORAL_TABLET | Freq: Two times a day (BID) | ORAL | 5 refills | Status: DC | PRN

## 2021-05-31 NOTE — Progress Notes (Signed)
? ?Established Patient Office Visit ? ?Subjective:  ?Patient ID: Nathan Walker, male    DOB: [birth year]  Age: 36 y.o. MRN: [id number] ? ?CC:  ?Chief Complaint  ?Patient presents with  ? Anxiety  ? ? ?HPI ?Nathan Walker presents for anxiety and depression follow up.  ? ?His Celexa dosage was increase about 2.5 weeks ago. He also take xanax prn. He reports that things have been a little better since his last visit. He is still living with his ex while in the process of figuring out where he will be transferred for work. This has been stressful.  ? ? ?  05/31/2021  ?  8:22 AM 05/17/2021  ? 11:59 AM 11/13/2018  ?  1:20 PM  ?Depression screen PHQ 2/9  ?Decreased Interest 1 1 1   ?Down, Depressed, Hopeless 2 2 1   ?PHQ - 2 Score 3 3 2   ?Altered sleeping 2 1 1   ?Tired, decreased energy 1 1 1   ?Change in appetite 2 1 0  ?Feeling bad or failure about yourself  1 1 1   ?Trouble concentrating 1 1 0  ?Moving slowly or fidgety/restless 1 0 1  ?Suicidal thoughts 0 1 0  ?PHQ-9 Score 11 9 6   ?Difficult doing work/chores Very difficult Somewhat difficult Somewhat difficult  ? ? ?  05/31/2021  ?  8:22 AM 05/17/2021  ? 12:00 PM  ?GAD 7 : Generalized Anxiety Score  ?Nervous, Anxious, on Edge 3 3  ?Control/stop worrying 2 3  ?Worry too much - different things 3 3  ?Trouble relaxing 3 3  ?Restless 2 2  ?Easily annoyed or irritable 2 2  ?Afraid - awful might happen 1 2  ?Total GAD 7 Score 16 18  ?Anxiety Difficulty Very difficult Very difficult  ? ? ? ?Past Medical History:  ?Diagnosis Date  ? Anxiety   ? ? ?Past Surgical History:  ?Procedure Laterality Date  ? WISDOM TOOTH EXTRACTION    ? ? ?No family history on file. ? ?Social History  ? ?Socioeconomic History  ? Marital status: Single  ?  Spouse name: Not on file  ? Number of children: Not on file  ? Years of education: Not on file  ? Highest education level: Not on file  ?Occupational History  ? Not on file  ?Tobacco Use  ? Smoking status: Never  ? Smokeless tobacco: Never  ?Vaping Use  ?  Vaping Use: Never used  ?Substance and Sexual Activity  ? Alcohol use: Yes  ?  Comment: occasionally  ? Drug use: No  ? Sexual activity: Not on file  ?Other Topics Concern  ? Not on file  ?Social History Narrative  ? Not on file  ? ?Social Determinants of Health  ? ?Financial Resource Strain: Not on file  ?Food Insecurity: Not on file  ?Transportation Needs: Not on file  ?Physical Activity: Not on file  ?Stress: Not on file  ?Social Connections: Not on file  ?Intimate Partner Violence: Not on file  ? ? ?Outpatient Medications Prior to Visit  ?Medication Sig Dispense Refill  ? ALPRAZolam (XANAX) 1 MG tablet Take 1 tablet (1 mg total) by mouth 2 (two) times daily as needed for anxiety. 30 tablet 0  ? cetirizine (ZYRTEC) 10 MG tablet Take 1 tablet (10 mg total) by mouth at bedtime. 90 tablet 3  ? citalopram (CELEXA) 20 MG tablet Take 1 tablet (20 mg total) by mouth daily. 30 tablet 3  ? famotidine (PEPCID) 40 MG tablet Take 1 tablet (40  mg total) by mouth daily. 90 tablet 3  ? finasteride (PROPECIA) 1 MG tablet TAKE 1 TABLET BY MOUTH EVERY DAY 30 tablet 11  ? loratadine (CLARITIN) 10 MG tablet TAKE 1 TABLET BY MOUTH EVERY DAY 90 tablet 1  ? minocycline (MINOCIN) 50 MG capsule TAKE 1 TABLET BY MOUTH 2 TIMES DAILY 60 capsule 11  ? ?No facility-administered medications prior to visit.  ? ? ?No Known Allergies ? ?ROS ?Review of Systems ?As per HPI.  ?  ?Objective:  ?  ?Physical Exam ?Vitals and nursing note reviewed.  ?Constitutional:   ?   General: He is not in acute distress. ?   Appearance: He is not ill-appearing, toxic-appearing or diaphoretic.  ?HENT:  ?   Nose: Nose normal.  ?Eyes:  ?   Extraocular Movements: Extraocular movements intact.  ?   Conjunctiva/sclera: Conjunctivae normal.  ?   Pupils: Pupils are equal, round, and reactive to light.  ?Cardiovascular:  ?   Rate and Rhythm: Normal rate and regular rhythm.  ?   Heart sounds: Normal heart sounds. No murmur heard. ?Pulmonary:  ?   Effort: No respiratory  distress.  ?   Breath sounds: Normal breath sounds.  ?Musculoskeletal:  ?   Right lower leg: No edema.  ?   Left lower leg: No edema.  ?Skin: ?   General: Skin is warm and dry.  ?Neurological:  ?   General: No focal deficit present.  ?   Mental Status: He is alert and oriented to person, place, and time.  ?Psychiatric:     ?   Mood and Affect: Mood normal.     ?   Behavior: Behavior normal.     ?   Thought Content: Thought content normal.     ?   Judgment: Judgment normal.  ? ? ?BP (!) 126/91   Pulse 82   Ht 5\' 9"  (1.753 m)   Wt 155 lb (70.3 kg)   SpO2 100%   BMI 22.89 kg/m?  ?Wt Readings from Last 3 Encounters:  ?05/31/21 155 lb (70.3 kg)  ?05/17/21 155 lb 8 oz (70.5 kg)  ?07/31/18 167 lb 6.4 oz (75.9 kg)  ? ? ? ?There are no preventive care reminders to display for this patient. ? ?There are no preventive care reminders to display for this patient. ? ?No results found for: TSH ?No results found for: WBC, HGB, HCT, MCV, PLT ?Lab Results  ?Component Value Date  ? NA 138 07/24/2008  ? K 3.7 07/24/2008  ? CO2 26 07/24/2008  ? GLUCOSE 95 07/24/2008  ? BUN 8 07/24/2008  ? CREATININE 1.00 07/24/2008  ? BILITOT 1.1 07/24/2008  ? ALKPHOS 92 07/24/2008  ? AST 53 (H) 07/24/2008  ? ALT 54 (H) 07/24/2008  ? PROT 8.0 07/24/2008  ? ALBUMIN 4.5 07/24/2008  ? CALCIUM 9.8 07/24/2008  ? ?No results found for: CHOL ?No results found for: HDL ?No results found for: LDLCALC ?No results found for: TRIG ?No results found for: CHOLHDL ?No results found for: HGBA1C ? ?  ?Assessment & Plan:  ? ?Rj was seen today for anxiety. ? ?Diagnoses and all orders for this visit: ? ?Depression, recurrent (HCC) ?Not well controlled but stable. Denies SI. Discussed that increased dosage of Celexa will still need additional time to be effective.  ? ?Generalized anxiety disorder ?Benzodiazepine dependence (HCC) ?Controlled substance agreement signed ?Improving. Continue celexa. PDMP reviewed, no red flags. CSA and UDS UTD. Refills provided today.   ?-  ALPRAZolam (XANAX) 1 MG tablet; Take 1 tablet (1 mg total) by mouth 2 (two) times daily as needed for anxiety. ? ? ?Follow-up: Return in about 6 weeks (around 07/12/2021) for medication follow up, virtial ok. ? ?The patient indicates understanding of these issues and agrees with the plan.  ? ? ?Gabriel Earingiffany M Cletis Muma, FNP ?

## 2021-05-31 NOTE — Patient Instructions (Signed)

## 2021-06-08 ENCOUNTER — Other Ambulatory Visit: Payer: Self-pay | Admitting: Family Medicine

## 2021-06-08 DIAGNOSIS — F411 Generalized anxiety disorder: Secondary | ICD-10-CM

## 2021-06-08 DIAGNOSIS — F339 Major depressive disorder, recurrent, unspecified: Secondary | ICD-10-CM

## 2021-07-12 ENCOUNTER — Telehealth: Payer: BC Managed Care – PPO | Admitting: Family Medicine

## 2021-09-13 ENCOUNTER — Telehealth: Payer: Self-pay | Admitting: Family Medicine

## 2021-09-13 NOTE — Telephone Encounter (Signed)
LMOVM RE: refill for Minocycline for acne, which he has prescribed by another provider. Suggested that he call the other provider. His visit with Tiffany on 05/31/21 does not have any mention of acne during this visit and he has not had any visit for this Dx since Prudy Feeler left in 2020

## 2021-09-13 NOTE — Telephone Encounter (Signed)
  Prescription Request  09/13/2021  Is this a "Controlled Substance" medicine? minocycline (MINOCIN) 50 MG capsule  Have you seen your PCP in the last 2 weeks? No last ov in April 2023. No upcoming apts  If YES, route message to pool  -  If NO, patient needs to be scheduled for appointment.  What is the name of the medication or equipment? minocycline (MINOCIN) 50 MG capsule  Have you contacted your pharmacy to request a refill? no   Which pharmacy would you like this sent to? [facility] in [place] [phone]  Pt has been out for 3 weeks. He uses this rx for acne purposes.    Patient notified that their request is being sent to the clinical staff for review and that they should receive a response within 2 business days.

## 2021-12-01 ENCOUNTER — Other Ambulatory Visit: Payer: Self-pay | Admitting: Family Medicine

## 2021-12-01 DIAGNOSIS — F1320 Sedative, hypnotic or anxiolytic dependence, uncomplicated: Secondary | ICD-10-CM

## 2021-12-01 DIAGNOSIS — F411 Generalized anxiety disorder: Secondary | ICD-10-CM

## 2021-12-01 DIAGNOSIS — Z79899 Other long term (current) drug therapy: Secondary | ICD-10-CM

## 2021-12-05 NOTE — Telephone Encounter (Signed)
Pt called to get a refill on his Xanax Rx. Explained to pt that he is due for his 6 month ck for this medicine and cannot get anymore refills until he is seen. Pt asked if he could do a televisit. Explained to pt that because his medicine is controlled, the appt needs to be in person. Pt voiced understanding and said that he would check his work schedule and call back to make an in person appt.

## 2022-01-09 ENCOUNTER — Ambulatory Visit: Payer: BC Managed Care – PPO | Admitting: Family Medicine

## 2022-01-09 ENCOUNTER — Encounter: Payer: Self-pay | Admitting: Family Medicine

## 2022-01-09 VITALS — BP 123/84 | HR 84 | Temp 98.8°F | Ht 69.0 in | Wt 154.0 lb

## 2022-01-09 DIAGNOSIS — Z23 Encounter for immunization: Secondary | ICD-10-CM

## 2022-01-09 DIAGNOSIS — F411 Generalized anxiety disorder: Secondary | ICD-10-CM | POA: Diagnosis not present

## 2022-01-09 DIAGNOSIS — F1320 Sedative, hypnotic or anxiolytic dependence, uncomplicated: Secondary | ICD-10-CM

## 2022-01-09 DIAGNOSIS — Z79899 Other long term (current) drug therapy: Secondary | ICD-10-CM

## 2022-01-09 DIAGNOSIS — F339 Major depressive disorder, recurrent, unspecified: Secondary | ICD-10-CM

## 2022-01-09 MED ORDER — ALPRAZOLAM 1 MG PO TABS: 1.0000 mg | ORAL_TABLET | Freq: Two times a day (BID) | ORAL | 5 refills | Status: DC | PRN

## 2022-01-09 MED ORDER — DULOXETINE HCL 60 MG PO CPEP: 60.0000 mg | ORAL_CAPSULE | Freq: Every day | ORAL | 3 refills | Status: DC

## 2022-01-09 NOTE — Addendum Note (Signed)
Addended by: Gabriel Earing on: 01/09/2022 12:13 PM   Modules accepted: Level of Service

## 2022-01-09 NOTE — Progress Notes (Signed)
Established Patient Office Visit  Subjective:  Patient ID: Nathan Walker, male    DOB: 10-03-1985  Age: 36 y.o. MRN: [id number]  CC:  Chief Complaint  Patient presents with   depression/anxiety f/u    HPI Nathan Walker presents for anxiety and depression follow up.   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 He job is very stressful. He also does not have many social contacts since he is in a new city. His parents visit frequently. He reports that his anxiety has improved some but is still not well controlled. He takes celexa 20 mg daily. He reports that he sometimes feels apathic with this however. He continues to take xanax prn, usually every few days or so. He has tried lexapro, prozac, and zoloft in the past. He doesn't recall if these were helpful or not.      01/09/2022    9:37 AM 05/31/2021    8:22 AM 05/17/2021   11:59 AM  Depression screen PHQ 2/9  Decreased Interest 1 1 1   Down, Depressed, Hopeless 1 2 2   PHQ - 2 Score 2 3 3   Altered sleeping 3 2 1   Tired, decreased energy 1 1 1   Change in appetite 1 2 1   Feeling bad or failure about yourself  1 1 1   Trouble concentrating 1 1 1   Moving slowly or fidgety/restless 1 1 0  Suicidal thoughts 1 0 1  PHQ-9 Score 11 11 9   Difficult doing work/chores Somewhat difficult Very difficult Somewhat difficult      01/09/2022    9:38 AM 05/31/2021    8:22 AM 05/17/2021   12:00 PM  GAD 7 : Generalized Anxiety Score  Nervous, Anxious, on Edge 3 3 3   Control/stop worrying 2 2 3   Worry too much - different things 2 3 3   Trouble relaxing 2 3 3   Restless 2 2 2   Easily annoyed or irritable 1 2 2   Afraid - awful might happen 1 1 2   Total GAD 7 Score 13 16 18   Anxiety Difficulty  Very difficult Very difficult     Past Medical History:  Diagnosis Date   Anxiety     Past Surgical History:  Procedure Laterality Date   WISDOM TOOTH EXTRACTION      No family history on  file.  Social History   Socioeconomic History   Marital status: Single    Spouse name: Not on file   Number of children: Not on file   Years of education: Not on file   Highest education level: Not on file  Occupational History   Not on file  Tobacco Use   Smoking status: Never   Smokeless tobacco: Never  Vaping Use   Vaping Use: Never used  Substance and Sexual Activity   Alcohol use: Yes    Comment: occasionally   Drug use: No   Sexual activity: Not on file  Other Topics Concern   Not on file  Social History Narrative   Not on file   Social Determinants of Health   Financial Resource Strain: Not on file  Food Insecurity: Not on file  Transportation Needs: Not on file  Physical Activity: Not on file  Stress: Not on file  Social Connections: Not on file  Intimate Partner Violence: Not on file    Outpatient Medications Prior to Visit  Medication Sig Dispense Refill   ALPRAZolam (XANAX) 1 MG tablet Take 1 tablet (1  mg total) by mouth 2 (two) times daily as needed for anxiety. 30 tablet 5   cetirizine (ZYRTEC) 10 MG tablet Take 1 tablet (10 mg total) by mouth at bedtime. 90 tablet 3   citalopram (CELEXA) 20 MG tablet TAKE 1 TABLET BY MOUTH EVERY DAY 90 tablet 0   famotidine (PEPCID) 40 MG tablet Take 1 tablet (40 mg total) by mouth daily. 90 tablet 3   finasteride (PROPECIA) 1 MG tablet TAKE 1 TABLET BY MOUTH EVERY DAY 30 tablet 11   loratadine (CLARITIN) 10 MG tablet TAKE 1 TABLET BY MOUTH EVERY DAY 90 tablet 1   minocycline (MINOCIN) 50 MG capsule TAKE 1 TABLET BY MOUTH 2 TIMES DAILY 60 capsule 11   No facility-administered medications prior to visit.    No Known Allergies  ROS Review of Systems As per HPI.    Objective:    Physical Exam Vitals and nursing note reviewed.  Constitutional:      General: He is not in acute distress.    Appearance: He is not ill-appearing, toxic-appearing or diaphoretic.  HENT:     Nose: Nose normal.  Eyes:      Extraocular Movements: Extraocular movements intact.     Conjunctiva/sclera: Conjunctivae normal.     Pupils: Pupils are equal, round, and reactive to light.  Cardiovascular:     Rate and Rhythm: Normal rate and regular rhythm.     Heart sounds: Normal heart sounds. No murmur heard. Pulmonary:     Effort: No respiratory distress.     Breath sounds: Normal breath sounds.  Musculoskeletal:     Right lower leg: No edema.     Left lower leg: No edema.  Skin:    General: Skin is warm and dry.  Neurological:     General: No focal deficit present.     Mental Status: He is alert and oriented to person, place, and time.  Psychiatric:        Mood and Affect: Mood normal.        Behavior: Behavior normal.        Thought Content: Thought content normal.        Judgment: Judgment normal.     BP 123/84   Pulse 84   Temp 98.8 F (37.1 C)   Ht 5\' 9"  (1.753 m)   Wt 154 lb (69.9 kg)   SpO2 96%   BMI 22.74 kg/m  Wt Readings from Last 3 Encounters:  01/09/22 154 lb (69.9 kg)  05/31/21 155 lb (70.3 kg)  05/17/21 155 lb 8 oz (70.5 kg)     Health Maintenance Due  Topic Date Due   INFLUENZA VACCINE  09/18/2021    There are no preventive care reminders to display for this patient.  No results found for: "TSH" No results found for: "WBC", "HGB", "HCT", "MCV", "PLT" Lab Results  Component Value Date   NA 138 07/24/2008   K 3.7 07/24/2008   CO2 26 07/24/2008   GLUCOSE 95 07/24/2008   BUN 8 07/24/2008   CREATININE 1.00 07/24/2008   BILITOT 1.1 07/24/2008   ALKPHOS 92 07/24/2008   AST 53 (H) 07/24/2008   ALT 54 (H) 07/24/2008   PROT 8.0 07/24/2008   ALBUMIN 4.5 07/24/2008   CALCIUM 9.8 07/24/2008   No results found for: "CHOL" No results found for: "HDL" No results found for: "LDLCALC" No results found for: "TRIG" No results found for: "CHOLHDL" No results found for: "HGBA1C"    Assessment & Plan:   Nathan Walker  was seen today for depression/anxiety f/u.  Diagnoses and all  orders for this visit:  Depression, recurrent (HCC) Fair control. Denies SI. Will switch to cymbalta as he reports apathy with Celexa and has tried numerous SSRIs in the past.  -     DULoxetine (CYMBALTA) 60 MG capsule; Take 1 capsule (60 mg total) by mouth daily.  Generalized anxiety disorder Benzodiazepine dependence (HCC) Controlled substance agreement signed Uncontrolled. Switch to cymbalta. Continue xanax prn. PDMP reviewed, no red flags. CSA and UDS are UTD.  -     ALPRAZolam (XANAX) 1 MG tablet; Take 1 tablet (1 mg total) by mouth 2 (two) times daily as needed for anxiety. -     DULoxetine (CYMBALTA) 60 MG capsule; Take 1 capsule (60 mg total) by mouth daily.  Need for immunization against influenza Flu vaccine today.    Follow-up: Return in about 6 weeks (around 02/20/2022) for medication follow up.  The patient indicates understanding of these issues and agrees with the plan.    Gabriel Earing, FNP

## 2022-05-08 ENCOUNTER — Other Ambulatory Visit: Payer: Self-pay | Admitting: Family Medicine

## 2022-05-08 DIAGNOSIS — Z79899 Other long term (current) drug therapy: Secondary | ICD-10-CM

## 2022-05-08 DIAGNOSIS — F1320 Sedative, hypnotic or anxiolytic dependence, uncomplicated: Secondary | ICD-10-CM

## 2022-05-08 DIAGNOSIS — F411 Generalized anxiety disorder: Secondary | ICD-10-CM

## 2022-09-02 ENCOUNTER — Ambulatory Visit: Payer: BC Managed Care – PPO | Admitting: Family Medicine

## 2022-09-02 ENCOUNTER — Encounter: Payer: Self-pay | Admitting: Family Medicine

## 2022-09-02 VITALS — BP 130/86 | HR 102 | Temp 97.3°F | Ht 69.0 in | Wt 149.4 lb

## 2022-09-02 DIAGNOSIS — F1320 Sedative, hypnotic or anxiolytic dependence, uncomplicated: Secondary | ICD-10-CM | POA: Diagnosis not present

## 2022-09-02 DIAGNOSIS — F411 Generalized anxiety disorder: Secondary | ICD-10-CM

## 2022-09-02 DIAGNOSIS — L509 Urticaria, unspecified: Secondary | ICD-10-CM

## 2022-09-02 DIAGNOSIS — F339 Major depressive disorder, recurrent, unspecified: Secondary | ICD-10-CM

## 2022-09-02 DIAGNOSIS — Z79899 Other long term (current) drug therapy: Secondary | ICD-10-CM | POA: Diagnosis not present

## 2022-09-02 MED ORDER — FAMOTIDINE 40 MG PO TABS
40.0000 mg | ORAL_TABLET | Freq: Every day | ORAL | 3 refills | Status: DC | PRN
Start: 2022-09-02 — End: 2023-08-08

## 2022-09-02 MED ORDER — BUSPIRONE HCL 5 MG PO TABS: 5.0000 mg | ORAL_TABLET | Freq: Two times a day (BID) | ORAL | 0 refills | Status: DC

## 2022-09-02 MED ORDER — LORATADINE 10 MG PO TABS
10.0000 mg | ORAL_TABLET | Freq: Every day | ORAL | 1 refills | Status: AC
Start: 2022-09-02 — End: ?

## 2022-09-02 MED ORDER — ALPRAZOLAM 1 MG PO TABS
1.0000 mg | ORAL_TABLET | Freq: Two times a day (BID) | ORAL | 5 refills | Status: DC | PRN
Start: 2022-09-02 — End: 2022-10-09

## 2022-09-02 NOTE — Progress Notes (Signed)
Established Patient Office Visit  Subjective   Patient ID: Nathan Walker, male    DOB: Jul 29, 1985  Age: 37 y.o. MRN: [id number]  Chief Complaint  Patient presents with   Medical Management of Chronic Issues    HPI Nathan Walker is here for a chronic follow up of anxiety and depression. He has stopped taking celexa since his last visit. He gets anxious about taking medication, so he prefers not to. He also feels like SSRIs make him feel worse at time. He has tried celexa, lexapro, cymbalta and stopped them all after a period due to ineffectiveness. He does get relief with xanax. He typically takes this 1x a day on his work days, sometimes 2x a day. He usually doesn't take it when he isn't working. 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 He has though about counseling to help him cope with this in a better way. He is still living in [place] but comes to visit his family in [place] often. He prefers to continue to follow up here because he feels comfortable here. Reprots passive SI. Denies plan or intent. He has family that he would reach out to.   He reports breaking out in hives 2-3x a week. He has taken claritin and pepcid in the past for this with some relief. He is also interested in a referral for allergy testing as he has not had testing done before.   He also hasn't had screening labs done in a long time. He is very anxious about having lab work done and has put this off because of it. He will plan to have this done at his next visit      09/02/2022    3:47 PM 01/09/2022    9:37 AM 05/31/2021    8:22 AM  Depression screen PHQ 2/9  Decreased Interest 2 1 1   Down, Depressed, Hopeless 2 1 2   PHQ - 2 Score 4 2 3   Altered sleeping 2 3 2   Tired, decreased energy 2 1 1   Change in appetite 2 1 2   Feeling bad or failure about yourself  2 1 1   Trouble concentrating 1 1 1   Moving slowly or fidgety/restless 1 1 1   Suicidal thoughts 2 1 0  PHQ-9 Score 16 11 11    Difficult doing work/chores Somewhat difficult Somewhat difficult Very difficult      09/02/2022    3:48 PM 01/09/2022    9:38 AM 05/31/2021    8:22 AM 05/17/2021   12:00 PM  GAD 7 : Generalized Anxiety Score  Nervous, Anxious, on Edge 2 3 3 3   Control/stop worrying 2 2 2 3   Worry too much - different things 2 2 3 3   Trouble relaxing 2 2 3 3   Restless 2 2 2 2   Easily annoyed or irritable 2 1 2 2   Afraid - awful might happen 2 1 1 2   Total GAD 7 Score 14 13 16 18   Anxiety Difficulty Somewhat difficult  Very difficult Very difficult       ROS As per HPI.    Objective:     BP 130/86   Pulse (!) 102   Temp (!) 97.3 F (36.3 C) (Temporal)   Ht 5\' 9"  (1.753 m)   Wt 149 lb 6.4 oz (67.8 kg)   SpO2 94%   BMI 22.06 kg/m    Physical Exam Vitals and nursing note reviewed.  Constitutional:      General: He is not in  acute distress.    Appearance: He is not ill-appearing, toxic-appearing or diaphoretic.  Cardiovascular:     Rate and Rhythm: Normal rate and regular rhythm.     Heart sounds: Normal heart sounds. No murmur heard. Pulmonary:     Effort: No respiratory distress.     Breath sounds: Normal breath sounds.  Musculoskeletal:     Cervical back: Normal range of motion. No rigidity.     Right lower leg: No edema.     Left lower leg: No edema.  Skin:    General: Skin is warm and dry.     Findings: No rash.  Neurological:     General: No focal deficit present.     Mental Status: He is alert and oriented to person, place, and time.  Psychiatric:        Attention and Perception: Attention normal.        Mood and Affect: Mood is anxious.        Speech: Speech normal.        Behavior: Behavior is not agitated, aggressive or hyperactive. Behavior is cooperative.      No results found for any visits on 09/02/22.    The ASCVD Risk score (Arnett DK, et al., 2019) failed to calculate for the following reasons:   The 2019 ASCVD risk score is only valid for ages 64  to 9    Assessment & Plan:   Nathan Walker was seen today for medical management of chronic issues.  Diagnoses and all orders for this visit:  Depression, recurrent (HCC) Generalized anxiety disorder Uncontrolled. Passive SI. Denies plan or intent. He is able to verbally contract for safety today. Will try buspar as he has failed multiples SSRIs in the past. He plans to reach out to his employeer regarding counseling services as this is available for free. CSA and UDS updated today. PDMP reviewed, no red flags.  -     ALPRAZolam (XANAX) 1 MG tablet; Take 1 tablet (1 mg total) by mouth 2 (two) times daily as needed for anxiety. -     ToxASSURE Select 13 (MW), Urine -     busPIRone (BUSPAR) 5 MG tablet; Take 1 tablet (5 mg total) by mouth 2 (two) times daily.  Benzodiazepine dependence (HCC) -     ALPRAZolam (XANAX) 1 MG tablet; Take 1 tablet (1 mg total) by mouth 2 (two) times daily as needed for anxiety. -     ToxASSURE Select 13 (MW), Urine  Controlled substance agreement signed -     ALPRAZolam (XANAX) 1 MG tablet; Take 1 tablet (1 mg total) by mouth 2 (two) times daily as needed for anxiety. -     ToxASSURE Select 13 (MW), Urine  Urticaria Claritin and pepcid as below. Referral to allergy.  -     loratadine (CLARITIN) 10 MG tablet; Take 1 tablet (10 mg total) by mouth daily. -     famotidine (PEPCID) 40 MG tablet; Take 1 tablet (40 mg total) by mouth daily as needed (hives). -     Ambulatory referral to Allergy   Return in about 6 months (around 03/05/2023) for CPE with labs. He will let me know how he is doing with buspar in a few weeks. Return to office for new or worsening symptoms, or if symptoms persist.    Gabriel Earing, FNP

## 2022-09-04 LAB — TOXASSURE SELECT 13 (MW), URINE

## 2022-09-10 DIAGNOSIS — A09 Infectious gastroenteritis and colitis, unspecified: Secondary | ICD-10-CM | POA: Diagnosis not present

## 2022-09-10 DIAGNOSIS — R5383 Other fatigue: Secondary | ICD-10-CM | POA: Diagnosis not present

## 2022-09-19 ENCOUNTER — Encounter: Payer: Self-pay | Admitting: *Deleted

## 2022-10-05 DIAGNOSIS — U071 COVID-19: Secondary | ICD-10-CM | POA: Diagnosis not present

## 2022-10-05 DIAGNOSIS — R051 Acute cough: Secondary | ICD-10-CM | POA: Diagnosis not present

## 2022-10-05 DIAGNOSIS — R509 Fever, unspecified: Secondary | ICD-10-CM | POA: Diagnosis not present

## 2022-10-08 ENCOUNTER — Telehealth: Payer: Self-pay | Admitting: Family Medicine

## 2022-10-08 DIAGNOSIS — Z79899 Other long term (current) drug therapy: Secondary | ICD-10-CM

## 2022-10-08 DIAGNOSIS — F411 Generalized anxiety disorder: Secondary | ICD-10-CM

## 2022-10-08 DIAGNOSIS — F1320 Sedative, hypnotic or anxiolytic dependence, uncomplicated: Secondary | ICD-10-CM

## 2022-10-08 NOTE — Telephone Encounter (Signed)
Pt is aware you are out of the office till tomorrow. He is in town till this weekend. Are you willing to send in some to get him through.

## 2022-10-09 MED ORDER — ALPRAZOLAM 1 MG PO TABS
1.0000 mg | ORAL_TABLET | Freq: Two times a day (BID) | ORAL | 0 refills | Status: DC | PRN
Start: 2022-10-09 — End: 2022-11-06

## 2022-10-09 NOTE — Telephone Encounter (Signed)
I have sent in 10 tablets.

## 2022-10-09 NOTE — Telephone Encounter (Signed)
Pt aware and voiced understanding 

## 2022-10-23 ENCOUNTER — Other Ambulatory Visit: Payer: Self-pay | Admitting: Family Medicine

## 2022-10-23 DIAGNOSIS — F1320 Sedative, hypnotic or anxiolytic dependence, uncomplicated: Secondary | ICD-10-CM

## 2022-10-23 DIAGNOSIS — Z79899 Other long term (current) drug therapy: Secondary | ICD-10-CM

## 2022-10-23 DIAGNOSIS — F411 Generalized anxiety disorder: Secondary | ICD-10-CM

## 2022-10-23 NOTE — Telephone Encounter (Signed)
Pt called in asking that these refills be sent over to [facility] in [place]. Pt is living in [place] until he moves back or finds a place in to [place].

## 2022-11-06 ENCOUNTER — Other Ambulatory Visit: Payer: Self-pay | Admitting: Family Medicine

## 2022-11-06 ENCOUNTER — Encounter: Payer: Self-pay | Admitting: Family Medicine

## 2022-11-06 ENCOUNTER — Ambulatory Visit: Payer: BC Managed Care – PPO | Admitting: Family Medicine

## 2022-11-06 VITALS — BP 124/79 | HR 104 | Temp 98.3°F | Ht 69.0 in | Wt 152.1 lb

## 2022-11-06 DIAGNOSIS — Z23 Encounter for immunization: Secondary | ICD-10-CM

## 2022-11-06 DIAGNOSIS — F339 Major depressive disorder, recurrent, unspecified: Secondary | ICD-10-CM | POA: Diagnosis not present

## 2022-11-06 DIAGNOSIS — F1320 Sedative, hypnotic or anxiolytic dependence, uncomplicated: Secondary | ICD-10-CM

## 2022-11-06 DIAGNOSIS — F411 Generalized anxiety disorder: Secondary | ICD-10-CM | POA: Diagnosis not present

## 2022-11-06 DIAGNOSIS — Z79899 Other long term (current) drug therapy: Secondary | ICD-10-CM | POA: Diagnosis not present

## 2022-11-06 MED ORDER — ALPRAZOLAM 1 MG PO TABS
1.0000 mg | ORAL_TABLET | Freq: Two times a day (BID) | ORAL | 5 refills | Status: DC | PRN
Start: 2022-11-06 — End: 2023-11-17

## 2022-11-06 NOTE — Progress Notes (Signed)
Acute Office Visit  Subjective:     Patient ID: Nathan Walker, male    DOB: 1985-06-27, 37 y.o.   MRN: [id number]  Chief Complaint  Patient presents with   Depression    Depression        Patient is in today for medication refills. He is now staying in [place] and is requesting that his refills be sent to [facility]. He has had a lot of stressful event recently. His grandmother passed and he is currently between jobs. He did not try buspar, primarily due to anxiety regarding trying a new medication. Denies SI. He will also contact his insurance regarding coverage for counseling.       11/06/2022    4:12 PM 09/02/2022    3:47 PM 01/09/2022    9:37 AM  Depression screen PHQ 2/9  Decreased Interest 2 2 1   Down, Depressed, Hopeless 2 2 1   PHQ - 2 Score 4 4 2   Altered sleeping 3 2 3   Tired, decreased energy 2 2 1   Change in appetite 2 2 1   Feeling bad or failure about yourself  2 2 1   Trouble concentrating 1 1 1   Moving slowly or fidgety/restless 2 1 1   Suicidal thoughts 0 2 1  PHQ-9 Score 16 16 11   Difficult doing work/chores Somewhat difficult Somewhat difficult Somewhat difficult      11/06/2022    4:11 PM 09/02/2022    3:48 PM 01/09/2022    9:38 AM 05/31/2021    8:22 AM  GAD 7 : Generalized Anxiety Score  Nervous, Anxious, on Edge 2 2 3 3   Control/stop worrying 2 2 2 2   Worry too much - different things 2 2 2 3   Trouble relaxing 2 2 2 3   Restless 1 2 2 2   Easily annoyed or irritable 2 2 1 2   Afraid - awful might happen 1 2 1 1   Total GAD 7 Score 12 14 13 16   Anxiety Difficulty Somewhat difficult Somewhat difficult  Very difficult     Review of Systems  Psychiatric/Behavioral:  Positive for depression.    As per HPI.      Objective:    BP 124/79   Pulse (!) 104   Temp 98.3 F (36.8 C) (Temporal)   Ht 5\' 9"  (1.753 m)   Wt 152 lb 2 oz (69 kg)   SpO2 95%   BMI 22.46 kg/m    Physical Exam Vitals and nursing note reviewed.  Constitutional:       General: He is not in acute distress.    Appearance: Normal appearance. He is not ill-appearing, toxic-appearing or diaphoretic.  HENT:     Head: Normocephalic and atraumatic.     Nose: Nose normal.     Mouth/Throat:     Mouth: Mucous membranes are moist.     Pharynx: Oropharynx is clear.  Cardiovascular:     Rate and Rhythm: Normal rate and regular rhythm.     Heart sounds: Normal heart sounds. No murmur heard. Pulmonary:     Effort: Pulmonary effort is normal. No respiratory distress.     Breath sounds: Normal breath sounds. No wheezing.  Musculoskeletal:     Cervical back: Neck supple. No rigidity.     Right lower leg: No edema.     Left lower leg: No edema.  Skin:    General: Skin is warm and dry.  Neurological:     General: No focal deficit present.     Mental Status:  He is alert and oriented to person, place, and time.  Psychiatric:        Mood and Affect: Mood normal.        Behavior: Behavior normal.        Thought Content: Thought content normal.        Judgment: Judgment normal.     No results found for any visits on 11/06/22.      Assessment & Plan:   Nathan Walker was seen today for depression.  Diagnoses and all orders for this visit:  Depression, recurrent (HCC) Uncontrolled. Denies SI. He will contact insurance about counseling.   Generalized anxiety disorder Benzodiazepine dependence (HCC) Controlled substance agreement signed PDMP reviewed, no red flags. Has not been picked up at previous pharmacy in 2 months. UDS and CSA are UTD. Refills sent to [facility] pharmacy. Discussed to try buspar, can even try 2.5 mg BID.  -     ALPRAZolam (XANAX) 1 MG tablet; Take 1 tablet (1 mg total) by mouth 2 (two) times daily as needed for anxiety.  Need for vaccination Flu vaccine today.   Keep scheduled follow up appt, sooner for new or worsening symptoms.   The patient indicates understanding of these issues and agrees with the plan.  Gabriel Earing, FNP

## 2022-12-04 ENCOUNTER — Other Ambulatory Visit: Payer: Self-pay | Admitting: Family Medicine

## 2022-12-04 DIAGNOSIS — F411 Generalized anxiety disorder: Secondary | ICD-10-CM

## 2022-12-04 DIAGNOSIS — F339 Major depressive disorder, recurrent, unspecified: Secondary | ICD-10-CM

## 2023-03-05 ENCOUNTER — Encounter: Payer: BC Managed Care – PPO | Admitting: Family Medicine

## 2023-08-08 ENCOUNTER — Ambulatory Visit (HOSPITAL_COMMUNITY)
Admission: EM | Admit: 2023-08-08 | Discharge: 2023-08-08 | Disposition: A | Discharge status: Home / Self Care | Attending: Family Medicine | Admitting: Family Medicine

## 2023-08-08 ENCOUNTER — Encounter (HOSPITAL_COMMUNITY): Payer: Self-pay

## 2023-08-08 DIAGNOSIS — H1132 Conjunctival hemorrhage, left eye: Secondary | ICD-10-CM

## 2023-08-08 NOTE — ED Triage Notes (Signed)
 Patient presenting with left eye trauma with fall onset Sunday before last. Eye is red and swollen, denies vision loss.   Prescriptions or OTC medications tried: No

## 2023-08-08 NOTE — Discharge Instructions (Addendum)
 Visine over-the-counter drops are the most likely to help the discoloration of your eye.  There is a good bit of bruising on that left eye, so I think it is can take a few weeks for the blood to go completely away.  You can try icing that time, and you can also try sleeping with your head elevated to see if that will help.  You can call the ophthalmologist listed in this paperwork for follow-up.
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
## 2023-11-17 ENCOUNTER — Encounter: Payer: Self-pay | Admitting: Family Medicine

## 2023-11-17 ENCOUNTER — Ambulatory Visit: Admitting: Family Medicine

## 2023-11-17 VITALS — BP 123/84 | HR 95 | Temp 98.2°F | Ht 70.0 in | Wt 149.2 lb

## 2023-11-17 DIAGNOSIS — Z79899 Other long term (current) drug therapy: Secondary | ICD-10-CM | POA: Diagnosis not present

## 2023-11-17 DIAGNOSIS — F411 Generalized anxiety disorder: Secondary | ICD-10-CM

## 2023-11-17 DIAGNOSIS — F1320 Sedative, hypnotic or anxiolytic dependence, uncomplicated: Secondary | ICD-10-CM

## 2023-11-17 DIAGNOSIS — F339 Major depressive disorder, recurrent, unspecified: Secondary | ICD-10-CM | POA: Diagnosis not present

## 2023-11-17 MED ORDER — BUSPIRONE HCL 5 MG PO TABS: 5.0000 mg | ORAL_TABLET | Freq: Two times a day (BID) | ORAL | 0 refills | Status: DC

## 2023-11-17 MED ORDER — ALPRAZOLAM 1 MG PO TABS: 1.0000 mg | ORAL_TABLET | Freq: Two times a day (BID) | ORAL | 5 refills | Status: AC | PRN

## 2023-11-17 NOTE — Progress Notes (Signed)
 Established Patient Office Visit  Subjective   Patient ID: Nathan Walker, male    DOB: Aug 11, 1985  Age: 38 y.o. MRN: [id number]  Chief Complaint  Patient presents with   Medical Management of Chronic Issues    HPI  History of Present Illness   ERON STAAT is a 38 year old male with depression and anxiety who presents for a follow-up visit.  Mood disturbance and anxiety - Depression and anxiety symptoms are fluctuating. - Slight improvement in anxiety since last visit. 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 - did not try Buspirone . - Xanax  prescription not filled since March; He refilled monthly to stockpile for as needed use.  - Using sparingly - No caffeine consumption. - anxious about trying medications - Denies SI  Sleep disturbance - Difficulty falling asleep and frequent awakenings every other hour. - Uses Z-Quil for sleep with only partial effectiveness. - No recent use of melatonin  - Poor sleep hygiene, including use of phone and television in bed.          11/17/2023    9:41 AM 11/06/2022    4:12 PM 09/02/2022    3:47 PM  Depression screen PHQ 2/9  Decreased Interest 1 2 2   Down, Depressed, Hopeless 2 2 2   PHQ - 2 Score 3 4 4   Altered sleeping 3 3 2   Tired, decreased energy 2 2 2   Change in appetite 1 2 2   Feeling bad or failure about yourself  1 2 2   Trouble concentrating 1 1 1   Moving slowly or fidgety/restless 1 2 1   Suicidal thoughts 1 0 2  PHQ-9 Score 13 16 16   Difficult doing work/chores Somewhat difficult Somewhat difficult Somewhat difficult      11/17/2023    9:42 AM 11/06/2022    4:11 PM 09/02/2022    3:48 PM 01/09/2022    9:38 AM  GAD 7 : Generalized Anxiety Score  Nervous, Anxious, on Edge 2 2 2 3   Control/stop worrying 2 2 2 2   Worry too much - different things 2 2 2 2   Trouble relaxing 2 2 2 2   Restless 0 1 2 2   Easily annoyed or irritable 2 2 2 1   Afraid - awful might happen 1 1 2 1   Total GAD 7  Score 11 12 14 13   Anxiety Difficulty Somewhat difficult Somewhat difficult Somewhat difficult        ROS As per HPI.    Objective:     BP 123/84   Pulse 95   Temp 98.2 F (36.8 C) (Temporal)   Ht 5' 10 (1.778 m)   Wt 149 lb 3.2 oz (67.7 kg)   SpO2 100%   BMI 21.41 kg/m    Physical Exam Vitals and nursing note reviewed.  Constitutional:      General: He is not in acute distress.    Appearance: Normal appearance. He is not ill-appearing, toxic-appearing or diaphoretic.  Cardiovascular:     Rate and Rhythm: Normal rate and regular rhythm.     Pulses: Normal pulses.     Heart sounds: Normal heart sounds. No murmur heard. Pulmonary:     Effort: Pulmonary effort is normal. No respiratory distress.     Breath sounds: Normal breath sounds.  Musculoskeletal:     Cervical back: No rigidity.     Right lower leg: No edema.     Left lower leg: No edema.  Skin:    General: Skin is warm and dry.  Neurological:     General: No focal deficit present.     Mental Status: He is alert and oriented to person, place, and time.  Psychiatric:        Mood and Affect: Mood normal.        Behavior: Behavior normal.      No results found for any visits on 11/17/23.    The ASCVD Risk score (Arnett DK, et al., 2019) failed to calculate for the following reasons:   The 2019 ASCVD risk score is only valid for ages 36 to 74    Assessment & Plan:   Farren was seen today for medical management of chronic issues.  Diagnoses and all orders for this visit:  Depression, recurrent -     busPIRone  (BUSPAR ) 5 MG tablet; Take 1 tablet (5 mg total) by mouth 2 (two) times daily.  Generalized anxiety disorder -     busPIRone  (BUSPAR ) 5 MG tablet; Take 1 tablet (5 mg total) by mouth 2 (two) times daily. -     Cancel: ToxASSURE Select 13 (MW), Urine -     ALPRAZolam  (XANAX ) 1 MG tablet; Take 1 tablet (1 mg total) by mouth 2 (two) times daily as needed for anxiety. -     ToxASSURE Select 13  (MW), Urine  Benzodiazepine dependence (HCC) -     Cancel: ToxASSURE Select 13 (MW), Urine -     ALPRAZolam  (XANAX ) 1 MG tablet; Take 1 tablet (1 mg total) by mouth 2 (two) times daily as needed for anxiety. -     ToxASSURE Select 13 (MW), Urine  Controlled substance agreement signed -     Cancel: ToxASSURE Select 13 (MW), Urine -     ALPRAZolam  (XANAX ) 1 MG tablet; Take 1 tablet (1 mg total) by mouth 2 (two) times daily as needed for anxiety. -     ToxASSURE Select 13 (MW), Urine      Generalized anxiety disorder with insomnia Anxiety slightly improved, insomnia persists. Xanax  used as needed, Buspirone  not tried. Suboptimal sleep hygiene. - Start melatonin 1 mg, increase to 5 mg if needed. - Educated on sleep hygiene: cool, dark room, white noise, no electronics 1 hour before bed, consistent schedule. - Advise bed use only for sleep or sex; if unable to sleep, do quiet activity elsewhere until drowsy. - Discussed Buspirone  for daily anxiety management, explained side effects, encouraged trial. - Send Buspirone  prescription to [facility] in [place]. - Refill Xanax  prescription. UDS and CSA updated today - PDMP reviewed, no red flags  Major depressive disorder, recurrent Symptoms not well-controlled. Anxiety and insomnia may contribute to depression. Denies SI.  - Encourage Buspirone  trial for potential improvement in depressive symptoms. - Discussed importance of lifestyle management improve depression.       Return in about 6 months (around 05/16/2024) for CPE.   The patient indicates understanding of these issues and agrees with the plan.  Annabella CHRISTELLA Search, FNP

## 2023-11-20 LAB — TOXASSURE SELECT 13 (MW), URINE

## 2024-01-27 ENCOUNTER — Encounter: Payer: Self-pay | Admitting: Family Medicine

## 2024-01-27 ENCOUNTER — Other Ambulatory Visit: Payer: Self-pay | Admitting: Family Medicine

## 2024-01-27 DIAGNOSIS — F339 Major depressive disorder, recurrent, unspecified: Secondary | ICD-10-CM

## 2024-01-27 DIAGNOSIS — F411 Generalized anxiety disorder: Secondary | ICD-10-CM

## 2024-01-27 NOTE — Telephone Encounter (Signed)
Left message to schedule appt

## 2024-01-27 NOTE — Telephone Encounter (Signed)
 Tiffany NTBS in March for 6 mos FU/CPE RF sent to pharmacy
